# Patient Record
Sex: Male | Born: 1965 | Race: White | Hispanic: No | Marital: Married | Smoking: Never smoker
Health system: Southern US, Community
[De-identification: ages and names within clinical notes are randomized; demographics above are authoritative.]

## PROBLEM LIST (undated history)

## (undated) DIAGNOSIS — I499 Cardiac arrhythmia, unspecified: Secondary | ICD-10-CM

## (undated) DIAGNOSIS — G44309 Post-traumatic headache, unspecified, not intractable: Secondary | ICD-10-CM

## (undated) DIAGNOSIS — I10 Essential (primary) hypertension: Secondary | ICD-10-CM

## (undated) DIAGNOSIS — J45909 Unspecified asthma, uncomplicated: Secondary | ICD-10-CM

## (undated) DIAGNOSIS — S0990XS Unspecified injury of head, sequela: Secondary | ICD-10-CM

## (undated) DIAGNOSIS — Z9109 Other allergy status, other than to drugs and biological substances: Secondary | ICD-10-CM

## (undated) HISTORY — DX: Other allergy status, other than to drugs and biological substances: Z91.09

## (undated) HISTORY — DX: Unspecified asthma, uncomplicated: J45.909

## (undated) HISTORY — DX: Post-traumatic headache, unspecified, not intractable: G44.309

## (undated) HISTORY — DX: Unspecified injury of head, sequela: S09.90XS

## (undated) HISTORY — DX: Essential (primary) hypertension: I10

## (undated) HISTORY — DX: Cardiac arrhythmia, unspecified: I49.9

---

## 2001-10-26 HISTORY — PX: WRIST ARTHROSCOPY: SHX838

## 2006-02-16 ENCOUNTER — Ambulatory Visit: Payer: Self-pay | Admitting: Specialist

## 2008-10-18 LAB — PULMONARY FUNCTION TEST

## 2008-12-27 LAB — PULMONARY FUNCTION TEST

## 2009-07-18 LAB — PULMONARY FUNCTION TEST

## 2009-07-24 LAB — PULMONARY FUNCTION TEST

## 2009-10-24 LAB — PULMONARY FUNCTION TEST

## 2014-01-26 ENCOUNTER — Ambulatory Visit: Payer: Self-pay | Admitting: Allergy

## 2014-03-05 ENCOUNTER — Ambulatory Visit (INDEPENDENT_AMBULATORY_CARE_PROVIDER_SITE_OTHER): Payer: BC Managed Care – PPO | Admitting: Pulmonary Disease

## 2014-03-05 ENCOUNTER — Encounter (INDEPENDENT_AMBULATORY_CARE_PROVIDER_SITE_OTHER): Payer: Self-pay

## 2014-03-05 ENCOUNTER — Encounter: Payer: Self-pay | Admitting: Pulmonary Disease

## 2014-03-05 VITALS — BP 122/70 | HR 89 | Ht 67.0 in | Wt 281.0 lb

## 2014-03-05 DIAGNOSIS — J45909 Unspecified asthma, uncomplicated: Secondary | ICD-10-CM | POA: Insufficient documentation

## 2014-03-05 DIAGNOSIS — R05 Cough: Secondary | ICD-10-CM

## 2014-03-05 DIAGNOSIS — R059 Cough, unspecified: Secondary | ICD-10-CM

## 2014-03-05 DIAGNOSIS — R058 Other specified cough: Secondary | ICD-10-CM | POA: Insufficient documentation

## 2014-03-05 DIAGNOSIS — K219 Gastro-esophageal reflux disease without esophagitis: Secondary | ICD-10-CM | POA: Insufficient documentation

## 2014-03-05 DIAGNOSIS — J309 Allergic rhinitis, unspecified: Secondary | ICD-10-CM

## 2014-03-05 NOTE — Assessment & Plan Note (Signed)
Given the prior chest tightness, wheezing and shortness of breath with a long history of allergies I think that asthma is very likely.  However, I would like to see some PFTs to assess the severity.  Plan: -obtain full PFT -let me know if he has recurrence of chest tightness, wheezing, or  Dyspnea; if he has recurrence of these would start QVar bid first

## 2014-03-05 NOTE — Assessment & Plan Note (Signed)
If symptoms return, would discuss next best step with Dr. Jenne CampusMcQueen For now, continue meds as written

## 2014-03-05 NOTE — Patient Instructions (Signed)
[facility] Keep taking your medicines as prescribed Let us know if you develop shortness of breath, wheezing or chest tightness  Follow the GERD diet  We will see you back in 3 months or sooner if needed

## 2014-03-05 NOTE — Assessment & Plan Note (Signed)
I explained to him today that I think that this is due to allergic rhinitis, GERD, and possibly asthma.  It seems like he has really improved since stopping the Singulair, Flonase and Symbicort, giving credence to the thought that this is mostly due to acid reflux.    Plan: -for now, continue GERD lifestyle modification as per Dr. Jenne CampusMcQueen -if cough returns with post nasal drip, then would resume saline rinses first and an antihistamine

## 2014-03-05 NOTE — Progress Notes (Signed)
Subjective:    Patient ID: Ryan Clements, male    DOB: 01/17/1966, 48 y.o.   MRN: [id number]  HPI  This is a 48 year old male who comes to our clinic today to establish care for cough, sinus disease, reflux, and asthma. He says that he had a normal childhood without respiratory illnesses but he did have some degree of sinus symptoms. However in 2009 he was diagnosed by his primary care physician is having cough. Asthma. York SpanielSaid that this is made worse by drinking cold beverages. He said typically he would get a cough, upper respiratory viral infection which would last for quite a long time. He would get over the initial illness but he would still have a persistent dry cough for weeks if not months on end.  He eventually had allergy testing by the [place] [facility] clinic here in town and was found to have allergies to cockroaches, and various pollens. He was started on immunotherapy. He said that this made it to her he would "I get sick as much" but he would still have cough when he did get sick. Typically, when he would get sick he was treated with antibiotics, prednisone, and albuterol. Despite this he might initially get better but the cough would persist.  So after putting up with ongoing symptoms for many years he switched to the [facility] and saw Dr. Jenne CampusMcQueen.  There he had a CT scan of his sinuses. His immunotherapy was started at Dr. Mikey BussingMcQueen's office as well. Dr. Jenne CampusMcQueen had him stop Symbicort, Singulair, and a nasal steroid as they did not seem to be helping. This is about 6 weeks ago. Since then he has been dieting and exercising on a regular basis. He has lost 15 pounds. He says his cough is improved dramatically.  When he describes the cough he says that typically would be dry cough that would last all day. It was typically worse with talking particularly on the phone. It is sometimes associated with chest tightness and shortness of breath with wheezing. However, he notes that  his wife would notice the shortness of breath more often than he would.  Past Medical History  Diagnosis Date  . Hypertension   . Irregular heart rhythm   . Asthma   . Headaches due to old head injury   . Environmental allergies      Family History  Problem Relation Age of Onset  . Allergies Mother   . Asthma Mother   . Heart disease Maternal Grandfather   . Heart disease Paternal Uncle   . Heart disease Maternal Uncle   . Heart disease Maternal Aunt   . Cancer Maternal Aunt     brain  . Cancer Maternal Grandmother     lung     History   Social History  . Marital Status: Married    Spouse Name: N/A    Number of Children: N/A  . Years of Education: N/A   Occupational History  . Not on file.   Social History Main Topics  . Smoking status: Never Smoker   . Smokeless tobacco: Never Used  . Alcohol Use: Yes     Comment: occasional wine or beer  . Drug Use: No  . Sexual Activity: Not on file   Other Topics Concern  . Not on file   Social History Narrative  . No narrative on file     Allergies  Allergen Reactions  . Codeine   . Erythromycin  No outpatient prescriptions prior to visit.   No facility-administered medications prior to visit.      Review of Systems  Constitutional: Negative for fever and unexpected weight change.  HENT: Positive for congestion. Negative for dental problem, ear pain, nosebleeds, postnasal drip, rhinorrhea, sinus pressure, sneezing, sore throat and trouble swallowing.   Eyes: Negative for redness and itching.  Respiratory: Positive for cough, chest tightness and shortness of breath. Negative for wheezing.   Cardiovascular: Negative for palpitations and leg swelling.  Gastrointestinal: Negative for nausea and vomiting.  Genitourinary: Negative for dysuria.  Musculoskeletal: Negative for joint swelling.  Skin: Negative for rash.  Neurological: Positive for headaches.  Hematological: Does not bruise/bleed easily.   Psychiatric/Behavioral: Negative for dysphoric mood. The patient is not nervous/anxious.        Objective:   Physical Exam Filed Vitals:   03/05/14 0923  BP: 122/70  Pulse: 89  Height: 5\' 7"  (1.702 m)  Weight: 281 lb (127.461 kg)  SpO2: 97%   Gen: well appearing, no acute distress HEENT: NCAT, PERRL, EOMi, OP clear, neck supple without masses PULM: CTA B CV: RRR, no mgr, no JVD AB: BS+, soft, nontender, no hsm Ext: warm, no edema, no clubbing, no cyanosis Derm: no rash or skin breakdown Neuro: A&Ox4, CN II-XII intact, strength 5/5 in all 4 extremities       Assessment & Plan:   Cough I explained to him today that I think that this is due to allergic rhinitis, GERD, and possibly asthma.  It seems like he has really improved since stopping the Singulair, Flonase and Symbicort, giving credence to the thought that this is mostly due to acid reflux.    Plan: -for now, continue GERD lifestyle modification as per Dr. Jenne CampusMcQueen -if cough returns with post nasal drip, then would resume saline rinses first and an antihistamine  Allergic rhinitis If symptoms return, would discuss next best step with Dr. Jenne CampusMcQueen For now, continue meds as written  Asthma, chronic Given the prior chest tightness, wheezing and shortness of breath with a long history of allergies I think that asthma is very likely.  However, I would like to see some PFTs to assess the severity.  Plan: -obtain full PFT -let me know if he has recurrence of chest tightness, wheezing, or  Dyspnea; if he has recurrence of these would start QVar 80mcg bid first   Updated Medication List Outpatient Encounter Prescriptions as of 03/05/2014  Medication Sig  . cetirizine (ZYRTEC) 10 MG tablet Take 10 mg by mouth daily. Only taking 2X/week  . EPINEPHrine (EPIPEN) 0.3 mg/0.3 mL IJ SOAJ injection Inject 0.3 mg into the muscle as needed.  Marland Kitchen. omeprazole (PRILOSEC) 40 MG capsule Take 40 mg by mouth daily.  . [DISCONTINUED]  albuterol (PROVENTIL HFA;VENTOLIN HFA) 108 (90 BASE) MCG/ACT inhaler Inhale 2 puffs into the lungs every 6 (six) hours as needed for wheezing or shortness of breath.  . [DISCONTINUED] budesonide-formoterol (SYMBICORT) 160-4.5 MCG/ACT inhaler Inhale 2 puffs into the lungs 2 (two) times daily.  . [DISCONTINUED] fluticasone (FLONASE) 50 MCG/ACT nasal spray Place 1 spray into both nostrils daily.  . [DISCONTINUED] montelukast (SINGULAIR) 10 MG tablet Take 10 mg by mouth at bedtime.  . [DISCONTINUED] predniSONE (STERAPRED UNI-PAK) 10 MG tablet Prednisone taper, take as directed.

## 2014-03-06 ENCOUNTER — Telehealth: Payer: Self-pay | Admitting: Pulmonary Disease

## 2014-03-06 NOTE — Telephone Encounter (Signed)
[facility]  Keep taking your medicines as prescribed  Let us know if you develop shortness of breath, wheezing or chest tightness  Follow the GERD diet  We will see you back in 3 months or sooner if needed --  PCC's did ya'll try calling pt to schedule PFT's? thanks

## 2014-03-06 NOTE — Telephone Encounter (Signed)
I spoke with pt informed him of PFT has been scheduled for [facility]. Ryan Clements.[name]

## 2014-03-13 ENCOUNTER — Ambulatory Visit: Payer: Self-pay | Admitting: Pulmonary Disease

## 2014-03-13 LAB — PULMONARY FUNCTION TEST

## 2014-03-15 ENCOUNTER — Encounter: Payer: Self-pay | Admitting: Pulmonary Disease

## 2014-03-16 ENCOUNTER — Telehealth: Payer: Self-pay

## 2014-03-16 ENCOUNTER — Ambulatory Visit: Payer: Self-pay | Admitting: Internal Medicine

## 2014-03-16 NOTE — Telephone Encounter (Signed)
Message copied by Velvet Bathe on Fri Mar 16, 2014  9:45 AM ------      Message from: Max Fickle B      Created: Thu Mar 15, 2014 10:47 PM       A,            Please let him know that this PFTs were completely normal            Thanks      B ------

## 2014-03-16 NOTE — Telephone Encounter (Signed)
Pt aware of results.  Nothing further needed.  

## 2014-03-20 ENCOUNTER — Encounter: Payer: Self-pay | Admitting: Pulmonary Disease

## 2014-04-25 ENCOUNTER — Encounter: Payer: Self-pay | Admitting: Pulmonary Disease

## 2014-06-18 ENCOUNTER — Telehealth: Payer: Self-pay | Admitting: Pulmonary Disease

## 2014-06-18 NOTE — Telephone Encounter (Addendum)
Spoke with the pt  He is c/o ongoing cough  Wants OV with BQ this wk  BQ did not have any openings until next month  OV with MW for tomorrow-[place] address given to pt  Nothing further needed

## 2014-06-19 ENCOUNTER — Ambulatory Visit (INDEPENDENT_AMBULATORY_CARE_PROVIDER_SITE_OTHER): Payer: BC Managed Care – PPO | Admitting: Internal Medicine

## 2014-06-19 ENCOUNTER — Encounter: Payer: Self-pay | Admitting: *Deleted

## 2014-06-19 ENCOUNTER — Encounter: Payer: Self-pay | Admitting: Internal Medicine

## 2014-06-19 VITALS — BP 106/60 | HR 77 | Temp 98.0°F | Ht 67.0 in | Wt 270.8 lb

## 2014-06-19 DIAGNOSIS — R059 Cough, unspecified: Secondary | ICD-10-CM

## 2014-06-19 DIAGNOSIS — R05 Cough: Secondary | ICD-10-CM

## 2014-06-19 DIAGNOSIS — R058 Other specified cough: Secondary | ICD-10-CM

## 2014-06-19 MED ORDER — TRAMADOL HCL 50 MG PO TABS: ORAL_TABLET | ORAL | Status: DC

## 2014-06-19 MED ORDER — FAMOTIDINE 20 MG PO TABS
ORAL_TABLET | ORAL | Status: DC
Start: 2014-06-19 — End: 2014-12-31

## 2014-06-19 MED ORDER — PREDNISONE 10 MG PO TABS: ORAL_TABLET | ORAL | Status: DC

## 2014-06-19 NOTE — Patient Instructions (Addendum)
Omeprazole 40 mg Take 30-60 min before first meal of the day and pepcid 20 mg at bedtime and chlortrimeton 4 mg x 2 at bedtime  Instead of zyrtec >> For drainage take chlortrimeton (chlorpheniramine) 4 mg every 4 hours available over the counter (may cause drowsiness)   Prednisone 10 mg take  4 each am x 2 days,   2 each am x 2 days,  1 each am x 2 days and stop   Take delsym two tsp every 12 hours and supplement if needed with  tramadol 50 mg up to 2 every 4 hours to suppress the urge to cough. Swallowing water or using ice chips/non mint and menthol containing candies (such as lifesavers or sugarless jolly ranchers) are also effective.  You should rest your voice and avoid activities that you know make you cough.  Once you have eliminated the cough for 3 straight days try reducing the tramadol first,  then the delsym as tolerated.   GERD (REFLUX)  is an extremely common cause of respiratory symptoms, many times with no significant heartburn at all.    It can be treated with medication, but also with lifestyle changes including avoidance of late meals, excessive alcohol, smoking cessation, and avoid fatty foods, chocolate, peppermint, colas, red wine, and acidic juices such as orange juice.  NO MINT OR MENTHOL PRODUCTS SO NO COUGH DROPS  USE SUGARLESS CANDY INSTEAD (jolley ranchers or Stover's)  NO OIL BASED VITAMINS - use powdered substitutes.    See Dr Kendrick Fries in 2 weeks to regroup      Reflux v irritable  Cough Differentiator  Reflux  Comments   Do you awaken from a sound sleep coughing violently?  With trouble breathing?  No, after wakening    Do you have choking episodes when you cannot Get enough air, gasping for air ?  yes    Do you usually cough when you lie down into The bed, or when you just lie down to rest ?  no    Do you usually cough after meals or eating?  No - maybe cold drinks    Do you cough when (or after) you bend over?  no    GERD SCORE       Reflux virritable  larynx Differentiator  Neurogenic    Do you more-or-less cough all day long?  yes    Does change of temperature make you cough?  yes    Does laughing or chuckling cause you to cough?  no    Do fumes (perfume, automobile fumes, burned Toast, etc.,) cause you to cough ?  no    Does speaking, singing, or talking on the phone cause you to cough ?  Yes    Neurogenic/Airway score

## 2014-06-19 NOTE — Progress Notes (Signed)
Subjective:   Patient ID: Ryan Clements, male    DOB: 1966/09/16   MRN: [id number]   Brief patient profile:   This is a 67 yowm never smoker intermittent cough x since at least around 2005 persistent daily  since around 2009  Ryan Clements ov 03/05/14   has chronic cough, sinus disease, reflux, and asthma. He says that he had a normal childhood without respiratory illnesses but he did have some degree of sinus symptoms. However in 2009 he was diagnosed by his primary care physician is having cough. Asthma. Ryan Clements that this is made worse by drinking cold beverages. He said typically he would get a cough, upper respiratory viral infection which would last for quite a long time. He would get over the initial illness but he would still have a persistent dry cough   - daily cough x 2009  - McQueen neg sinus w/u   He eventually had allergy testing by the [facility] allergy clinic here in town and was found to have allergies to cockroaches, and various pollens. He was started on immunotherapy. He said that this helped where he would "I get sick as much" but he would still have cough when he did get sick. Typically, when he would get sick he was treated with antibiotics, prednisone, and albuterol. Despite this he might initially get better but the cough would persist.  - allergy shots were started 2009 and ongoing   So after putting up with ongoing symptoms for many years he switched to the [facility] and saw Dr. Jenne Clements.  There he had a CT scan of his sinuses. His immunotherapy was started at Dr. Mikey Clements office as well. Dr. Jenne Clements had him stop Symbicort, Singulair, and a nasal steroid as they did not seem to be helping.  rec We will pulmonary function test at [facility] Keep taking your medicines as prescribed Let us know if you develop shortness of breath, wheezing or chest tightness Follow the GERD diet    06/19/2014 f/u ov/Ryan Clements re: Chief Complaint  Patient presents with  . Acute Visit   Pt states that he devoloped bronchitis approx 3 wks ago and cough still has not resolved.  Cough is mainly non prod and trigerred by cold water.     last" better" while off everything but reflux meds/ allergy shorts/ zyrtec/ but still dry cough  Then acutely worse with "head cold again" 3 week prior to OV    with fever/ nasty mucus rx Ryan Clements  augmentin/ prednisone/ no neb  On omeprazole 40 mg Take 30-60 min before first meal of the day as maint, now over the acute "head cold" symptoms but back to baseline cough:  [name] Reflux Comments  Do you awaken from a sound sleep coughing violently?                            With trouble breathing? No, after wakening    Do you have choking episodes when you cannot  Get enough air, gasping for air ?              yes   Do you usually cough when you lie down into  The bed, or when you just lie down to rest ?                          no   Do you usually cough  after meals or eating?         No - maybe cold drinks   Do you cough when (or after) you bend over?    no   GERD SCORE     [name]   Do you more-or-less cough all day long? yes   Does change of temperature make you cough? yes   Does laughing or chuckling cause you to cough? no   Do fumes (perfume, automobile fumes, burned  Toast, etc.,) cause you to cough ?      no   Does speaking, singing, or talking on the phone cause you to cough   ?               Yes    Neurogenic/Airway score      No obvious day to day or daytime variabilty or assoc  cp or chest tightness, subjective wheeze overt active  sinus or hb symptoms. No unusual exp hx or h/o childhood pna/ asthma or knowledge of premature birth.   Also denies any obvious fluctuation of symptoms with weather or environmental changes or other aggravating or alleviating factors except as outlined above   Current Medications, Allergies, Complete Past Medical  History, Past Surgical History, Family History, and Social History were reviewed in [employer] record.  ROS  The following are not active complaints unless bolded sore throat, dysphagia, dental problems, itching, sneezing,  nasal congestion or excess/ purulent secretions, ear ache,   fever, chills, sweats, unintended wt loss, pleuritic or exertional cp, hemoptysis,  orthopnea pnd or leg swelling, presyncope, palpitations, heartburn, abdominal pain, anorexia, nausea, vomiting, diarrhea  or change in bowel or urinary habits, change in stools or urine, dysuria,hematuria,  rash, arthralgias, visual complaints, headache, numbness weakness or ataxia or problems with walking or coordination,  change in mood/affect or memory.      Past Medical History  Diagnosis Date  . Hypertension   . Irregular heart rhythm   . Asthma   . Headaches due to old head injury   . Environmental allergies      Family History  Problem Relation Age of Onset  . Allergies Mother   . Asthma Mother   . Heart disease Maternal Grandfather   . Heart disease Paternal Uncle   . Heart disease Maternal Uncle   . Heart disease Maternal Aunt   . Cancer Maternal Aunt     brain  . Cancer Maternal Grandmother     lung     History   Social History  . Marital Status: Married    Spouse Name: N/A    Number of Children: N/A  . Years of Education: N/A   Occupational History  . Not on file.   Social History Main Topics  . Smoking status: Never Smoker   . Smokeless tobacco: Never Used  . Alcohol Use: Yes     Comment: occasional wine or beer  . Drug Use: No  . Sexual Activity: Not on file   Other Topics Concern  . Not on file   Social History Narrative  . No narrative on file     Allergies  Allergen Reactions  . Codeine   . Erythromycin               Objective:   Physical Exam  Wt Readings from Last 3 Encounters:  06/19/14 270 lb 12.8 oz (122.834 kg)  03/05/14 281 lb (127.461  kg)     Gen: well  appearing, no acute distress/ extremely harsh barking quality cough    HEENT: nl dentition, turbinates, and orophanx. Nl external ear canals without cough reflex   NECK :  without JVD/Nodes/TM/ nl carotid upstrokes bilaterally   LUNGS: no acc muscle use, clear to A and P bilaterally without cough on insp or exp maneuvers   CV:  RRR  no s3 or murmur or increase in P2, no edema   ABD:  soft and nontender with nl excursion in the supine position. No bruits or organomegaly, bowel sounds nl  MS:  warm without deformities, calf tenderness, cyanosis or clubbing  SKIN: warm and dry without lesions    NEURO:  alert, approp, no deficits          Assessment & Plan:

## 2014-06-19 NOTE — Assessment & Plan Note (Signed)
The most common causes of chronic cough in immunocompetent adults include the following: upper airway cough syndrome (UACS), previously referred to as postnasal drip syndrome (PNDS), which is caused by variety of rhinosinus conditions; (2) asthma; (3) GERD; (4) chronic bronchitis from cigarette smoking or other inhaled environmental irritants; (5) nonasthmatic eosinophilic bronchitis; and (6) bronchiectasis.   These conditions, singly or in combination, have accounted for up to 94% of the causes of chronic cough in prospective studies.   Other conditions have constituted no >6% of the causes in prospective studies These have included bronchogenic carcinoma, chronic interstitial pneumonia, sarcoidosis, left ventricular failure, ACEI-induced cough, and aspiration from a condition associated with pharyngeal dysfunction.    Chronic cough is often simultaneously caused by more than one condition. A single cause has been found from 38 to 82% of the time, multiple causes from 18 to 62%. Multiply caused cough has been the result of three diseases up to 42% of the time.       Based on hx and exam, this is most likely:  Classic Upper airway cough syndrome, so named because it's frequently impossible to sort out how much is  CR/sinusitis with freq throat clearing (which can be related to primary GERD)   vs  causing  secondary (" extra esophageal")  GERD from wide swings in gastric pressure that occur with throat clearing, often  promoting self use of mint and menthol lozenges that reduce the lower esophageal sphincter tone and exacerbate the problem further in a cyclical fashion.   These are the same pts (now being labeled as having "irritable larynx syndrome" by some cough centers) who not infrequently have a history of having failed to tolerate ace inhibitors,  dry powder inhalers or biphosphonates or report having atypical reflux symptoms that don't respond to standard doses of PPI , and are easily confused as  having aecopd or asthma flares by even experienced allergists/ pulmonologists.   The first step is to maximize acid suppression and eliminate cyclical coughing then regroup if the cough persists with Dr Kendrick Fries  Discussed with pt The standardized cough guidelines published in Chest by [place] in 2006 are still the best available and consist of a multiple step process (up to 12!) , not a single office visit,  and are intended  to address this problem logically,  with an alogrithm dependent on response to empiric treatment at  each progressive step  to determine a specific diagnosis with  minimal addtional testing needed. Therefore if adherence is an issue or can't be accurately verified,  it's very unlikely the standard evaluation and treatment will be successful here.    Furthermore, response to therapy (other than acute cough suppression, which should only be used short term with avoidance of narcotic containing cough syrups if possible), can be a gradual process for which the patient may perceive immediate benefit.  Unlike going to an eye doctor where the best perscription is almost always the first one and is immediately effective, this is almost never the case in the management of chronic cough syndromes. Therefore the patient needs to commit up front to consistently adhere to recommendations  for up to 6 weeks of therapy directed at the likely underlying problem(s) before the response can be reasonably evaluated.   See instructions for specific recommendations which were reviewed directly with the patient who was given a copy with highlighter outlining the key components.

## 2014-06-25 ENCOUNTER — Ambulatory Visit (INDEPENDENT_AMBULATORY_CARE_PROVIDER_SITE_OTHER): Payer: BC Managed Care – PPO | Admitting: Internal Medicine

## 2014-06-25 ENCOUNTER — Encounter: Payer: Self-pay | Admitting: Internal Medicine

## 2014-06-25 ENCOUNTER — Telehealth: Payer: Self-pay | Admitting: Internal Medicine

## 2014-06-25 VITALS — BP 110/70 | HR 70 | Temp 98.0°F | Ht 67.0 in | Wt 269.0 lb

## 2014-06-25 DIAGNOSIS — R058 Other specified cough: Secondary | ICD-10-CM

## 2014-06-25 DIAGNOSIS — R059 Cough, unspecified: Secondary | ICD-10-CM

## 2014-06-25 DIAGNOSIS — R05 Cough: Secondary | ICD-10-CM

## 2014-06-25 DIAGNOSIS — J309 Allergic rhinitis, unspecified: Secondary | ICD-10-CM

## 2014-06-25 MED ORDER — FLUTTER DEVI
Status: DC
Start: 2014-06-25 — End: 2017-01-20

## 2014-06-25 MED ORDER — GABAPENTIN 100 MG PO CAPS: 100.0000 mg | ORAL_CAPSULE | Freq: Three times a day (TID) | ORAL | Status: DC

## 2014-06-25 MED ORDER — MEPERIDINE HCL 50 MG PO TABS: 50.0000 mg | ORAL_TABLET | ORAL | Status: DC | PRN

## 2014-06-25 NOTE — Telephone Encounter (Signed)
Discussed by phone Demerol dose should start at 1 every 4 and up to 2 every 4 if still coughing.  The cpap use is fine, it will not interfere.  The point is that the harsh cough is slamming his airway together and must be stopped to fix this problem and the only time I want him to use the flutter is to have if still  breaks through the demerol (it is sometimes used to promote mucus clearance but in his case he has no excess mucus so I want him to use it differently)

## 2014-06-25 NOTE — Progress Notes (Signed)
Subjective:   Patient ID: Ryan Clements, male    DOB: April 09, 1966   MRN: [id number]   Brief patient profile:   This is a 24 yowm never smoker intermittent cough x since at least around 2005 persistent daily  since around 2009  McQuaid ov 03/05/14   has chronic cough, sinus disease, reflux, and asthma. He says that he had a normal childhood without respiratory illnesses but he did have some degree of sinus symptoms. However in 2009 he was diagnosed by his primary care physician is having cough. Asthma. [place] Spaniel that this is made worse by drinking cold beverages. He said typically he would get a cough, upper respiratory viral infection which would last for quite a long time. He would get over the initial illness but he would still have a persistent dry cough   - daily cough x 2009  - McQueen neg sinus w/u   He eventually had allergy testing by the [facility] here in town and was found to have allergies to cockroaches, and various pollens. He was started on immunotherapy. He said that this helped where he would "I get sick as much" but he would still have cough when he did get sick. Typically, when he would get sick he was treated with antibiotics, prednisone, and albuterol. Despite this he might initially get better but the cough would persist.  - allergy shots were started 2009 and ongoing   So after putting up with ongoing symptoms for many years he switched to the [facility] and saw Dr. Jenne Campus.  There he had a CT scan of his sinuses. His immunotherapy was started at Dr. Mikey Bussing office as well. Dr. Jenne Campus had him stop Symbicort, Singulair, and a nasal steroid as they did not seem to be helping.  rec We will pulmonary function test at [facility] Keep taking your medicines as prescribed Let us know if you develop shortness of breath, wheezing or chest tightness Follow the GERD diet    06/19/2014 f/u ov/Ryan Clements re:  Refractory chronic cough x 6 y Chief Complaint  Patient  presents with  . Acute Visit    Pt states that he devoloped bronchitis approx 3 wks ago and cough still has not resolved.  Cough is mainly non prod and trigerred by cold water.     last" better" while off everything but reflux meds/ allergy shorts/ zyrtec/ but still dry cough  Then acutely worse with "head cold again" 3 week prior to OV    with fever/ nasty mucus rx Jenne Campus  augmentin/ prednisone/ no neb  On omeprazole 40 mg Take 30-60 min before first meal of the day as maint, now over the acute "head cold" symptoms but back to baseline cough:  [name] Reflux Comments  Do you awaken from a sound sleep coughing violently?                            With trouble breathing? No, after wakening    Do you have choking episodes when you cannot  Get enough air, gasping for air ?              yes   Do you usually cough when you lie down into  The bed, or when you just lie down to rest ?  no   Do you usually cough after meals or eating?         No - maybe cold drinks   Do you cough when (or after) you bend over?    no   GERD SCORE     [name]   Do you more-or-less cough all day long? yes   Does change of temperature make you cough? yes   Does laughing or chuckling cause you to cough? no   Do fumes (perfume, automobile fumes, burned  Toast, etc.,) cause you to cough ?      no   Does speaking, singing, or talking on the phone cause you to cough   ?               Yes    Neurogenic/Airway score        06/25/2014 f/u ov/Ryan Clements re: refractory daily cough x 6y Chief Complaint  Patient presents with  . Acute Visit    Pt states that his cough is only better when he takes the medications prescribed at the last visit. He has not went a day without any cough. He states that when meds start to wear off his cough is worse.    even on tramadol 50 2 every 4 hours still harsh barking cough or  arrival. Made worse by voice use. Did not have wife review the cough questionaire as requested. "nothing's changed" - it's accurate (while at last ov was constantly texting wife to confirm the hx was accurate    No obvious day to day or daytime variabilty or assoc sob  cp or chest tightness, subjective wheeze overt active  sinus or hb symptoms. No unusual exp hx or h/o childhood pna/ asthma or knowledge of premature birth.   Also denies any obvious fluctuation of symptoms with weather or environmental changes or other aggravating or alleviating factors except as outlined above   Current Medications, Allergies, Complete Past Medical History, Past Surgical History, Family History, and Social History were reviewed in [employer] record.  ROS  The following are not active complaints unless bolded sore throat, dysphagia, dental problems, itching, sneezing,  nasal congestion or excess/ purulent secretions, ear ache,   fever, chills, sweats, unintended wt loss, pleuritic or exertional cp, hemoptysis,  orthopnea pnd or leg swelling, presyncope, palpitations, heartburn, abdominal pain, anorexia, nausea, vomiting, diarrhea  or change in bowel or urinary habits, change in stools or urine, dysuria,hematuria,  rash, arthralgias, visual complaints, headache, numbness weakness or ataxia or problems with walking or coordination,  change in mood/affect or memory.      Past Medical History  Diagnosis Date  . Hypertension   . Irregular heart rhythm   . Asthma   . Headaches due to old head injury   . Environmental allergies      Family History  Problem Relation Age of Onset  . Allergies Mother   . Asthma Mother   . Heart disease Maternal Grandfather   . Heart disease Paternal Uncle   . Heart disease Maternal Uncle   . Heart disease Maternal Aunt   . Cancer Maternal Aunt     brain  . Cancer Maternal Grandmother     lung     History   Social History  . Marital Status: Married     Spouse Name: N/A    Number of Children: N/A  . Years of Education: N/A   Occupational History  . Not on file.  Social History Main Topics  . Smoking status: Never Smoker   . Smokeless tobacco: Never Used  . Alcohol Use: Yes     Comment: occasional wine or beer  . Drug Use: No  . Sexual Activity: Not on file   Other Topics Concern  . Not on file   Social History Narrative  . No narrative on file     Allergies  Allergen Reactions  . Codeine   . Erythromycin               Objective:   Physical Exam  06/25/2014        269  Wt Readings from Last 3 Encounters:  06/19/14 270 lb 12.8 oz (122.834 kg)  03/05/14 281 lb (127.461 kg)     Gen: well appearing, no acute distress/ extremely harsh barking quality cough    HEENT: nl dentition, turbinates, and orophanx. Nl external ear canals without cough reflex   NECK :  without JVD/Nodes/TM/ nl carotid upstrokes bilaterally   LUNGS: no acc muscle use, clear to A and P bilaterally without cough on insp or exp maneuvers   CV:  RRR  no s3 or murmur or increase in P2, no edema   ABD:  soft and nontender with nl excursion in the supine position. No bruits or organomegaly, bowel sounds nl  MS:  warm without deformities, calf tenderness, cyanosis or clubbing  SKIN: warm and dry without lesions    NEURO:  alert, approp, no deficits     No cxr on file      Assessment & Plan:

## 2014-06-25 NOTE — Telephone Encounter (Signed)
Pt returning call.Stanley A Dalton ° °

## 2014-06-25 NOTE — Telephone Encounter (Signed)
Spoke with the pt  He states that he was reading about demerol and the fine print says to let your doctor know if you have sleep apnea  He is concerned b/c he has OSA  Wants to make sure MW is aware of this  He also c/o the flutter device making him cough more  Please advise thanks

## 2014-06-25 NOTE — Patient Instructions (Addendum)
Take delsym two tsp every 12 hours and supplement if needed with demerol  50 mg up to 2 every 4 hours to suppress the urge to cough. Swallowing water or using ice chips/non mint and menthol containing candies (such as lifesavers or sugarless jolly ranchers) are also effective.  You should rest your voice and avoid activities that you know make you cough.  Once you have eliminated the cough for 3 straight days try reducing the demerol  first,  then the delsym as tolerated.    When you cough, always cough into the flutter valve to prevent you from traumatizing your upper airway   neurontin 100 mg three times a day until you see Dr Marinell Blight

## 2014-06-25 NOTE — Telephone Encounter (Signed)
lmomtcb x1 

## 2014-06-26 NOTE — Assessment & Plan Note (Signed)
2009 Allergy testing> 4+ feathers, house duse, dust mite, american cockroach  rec continue allergy shots and use 1st gen H1 as per guidelines to eliminate pnds from contributing to cough

## 2014-06-26 NOTE — Assessment & Plan Note (Addendum)
Cyclical cough regimen 06/19/2014 > not effective  I had an extended discussion with the patient today lasting 15 to 20 minutes of a 25 minute visit on the following issues:  He will continue to cough as long as he continues to cough this violently regardless of any factor that initiates the cough in the first place.  rec Demerol acutely to control the cough and flutter valve when starts to cough to prevent airway trauma neurontin 100 tid new maint rx to increase to max of 300 tid  Consideration for referral to [facility] voice center if not able to control at this level  See instructions for specific recommendations which were reviewed directly with the patient who was given a copy with highlighter outlining the key components.

## 2014-06-29 ENCOUNTER — Telehealth: Payer: Self-pay | Admitting: Internal Medicine

## 2014-06-29 ENCOUNTER — Encounter: Payer: Self-pay | Admitting: Internal Medicine

## 2014-06-29 NOTE — Telephone Encounter (Signed)
Per 06/25/14:" Patient Instructions      Take delsym two tsp every 12 hours and supplement if needed with demerol  50 mg up to 2 every 4 hours to suppress the urge to cough. Swallowing water or using ice chips/non mint and menthol containing candies (such as lifesavers or sugarless jolly ranchers) are also effective.  You should rest your voice and avoid activities that you know make you cough. Once you have eliminated the cough for 3 straight days try reducing the demerol  first,  then the delsym as tolerated.   When you cough, always cough into the flutter valve to prevent you from traumatizing your upper airway  neurontin 100 mg three times a day until you see Dr Bethanie Dicker spoke with pt. He reports he has followed step by step of what MW recommended. Pt reports during this time his cough subsided 95%. Pt stopped the demerol yesterday afternoon. Since today he has restarted the cough like he did in the beginning. Please advise MW thanks  Allergies  Allergen Reactions  . Codeine   . Erythromycin

## 2014-06-29 NOTE — Telephone Encounter (Signed)
Goal was 100% - next step is to return to see mcQuaid or Tammy NP with all meds in hand to regroup  In meantime ok to push the neurontin to 300 tid   Can't do more demerol RX without ov but can take what he has left - nothing else to offer unless he wants to get worked in this pm as I am off

## 2014-06-29 NOTE — Telephone Encounter (Signed)
Called pt, did get 95% relief but not taking neurontin as directed   rec titrate up neurontin to 300 tid and if tolerates this dose needs a new rx called in next week. If doesn't tolerate this dose needs early appt with Tammy or McQuaid as no more narcotics should be used here

## 2014-06-29 NOTE — Telephone Encounter (Signed)
Called and spoke with pt and he stated that he is not getting any better and does not feel that he can continue to come in and pay the 45$ copay and then pay for other medications that he may not be able to use. The pt stated that he is not feeling any differently than he was when MW seen him on Monday.  He stated that he would just like to talk to TP on the phone .  Spoke with TP and will forward this message back to MW.  MW could you call and speak with the pt?  thanks

## 2014-07-05 ENCOUNTER — Telehealth: Payer: Self-pay | Admitting: Pulmonary Disease

## 2014-07-05 NOTE — Telephone Encounter (Signed)
Per 06/25/14 OV: neurontin 100 mg three times a day until you see Dr Marinell Blight    Please advise MW if okay to send in RX? thanks

## 2014-07-05 NOTE — Telephone Encounter (Signed)
OK by me but I think this message was meant for Dr. Sherene Sires

## 2014-07-06 MED ORDER — GABAPENTIN 100 MG PO CAPS: 100.0000 mg | ORAL_CAPSULE | Freq: Three times a day (TID) | ORAL | Status: DC

## 2014-07-06 NOTE — Telephone Encounter (Signed)
Dr. Sherene Sires, are you ok with rx as well?

## 2014-07-06 NOTE — Telephone Encounter (Signed)
Spoke with pt and clarified rx directions per MW.  Pt verbalized understanding.  Rx sent.

## 2014-07-06 NOTE — Telephone Encounter (Signed)
The plan was for him to gradually increase his neurontin to goal of 300 tid using the 100's  So ok to rx with the 300 dose @ tid but make sure he does the math correctly on the conversion and only give 90 of the 300s with no refills

## 2014-07-10 ENCOUNTER — Ambulatory Visit (INDEPENDENT_AMBULATORY_CARE_PROVIDER_SITE_OTHER)
Admission: RE | Admit: 2014-07-10 | Discharge: 2014-07-10 | Disposition: A | Discharge status: Home / Self Care | Payer: BC Managed Care – PPO | Source: Ambulatory Visit | Attending: Pulmonary Disease | Admitting: Pulmonary Disease

## 2014-07-10 ENCOUNTER — Ambulatory Visit (INDEPENDENT_AMBULATORY_CARE_PROVIDER_SITE_OTHER): Payer: BC Managed Care – PPO | Admitting: Pulmonary Disease

## 2014-07-10 ENCOUNTER — Encounter: Payer: Self-pay | Admitting: Pulmonary Disease

## 2014-07-10 VITALS — BP 128/70 | HR 77 | Ht 67.0 in | Wt 273.0 lb

## 2014-07-10 DIAGNOSIS — R05 Cough: Secondary | ICD-10-CM

## 2014-07-10 DIAGNOSIS — R059 Cough, unspecified: Secondary | ICD-10-CM

## 2014-07-10 DIAGNOSIS — R058 Other specified cough: Secondary | ICD-10-CM

## 2014-07-10 MED ORDER — GABAPENTIN 300 MG PO CAPS: 300.0000 mg | ORAL_CAPSULE | Freq: Three times a day (TID) | ORAL | Status: DC

## 2014-07-10 MED ORDER — AMITRIPTYLINE HCL 25 MG PO TABS: 25.0000 mg | ORAL_TABLET | Freq: Every day | ORAL | Status: DC

## 2014-07-10 NOTE — Assessment & Plan Note (Addendum)
Ryan Clements does not have asthma as documented by my partner and as seen in completely normal lung function tests.  He does have ongoing laryngeal irritation perpetuated by a severe harsh cough which he refuses to suppress as well as likely some degree of acid reflux and postnasal drip. Is allergic rhinitis has been acting up lately and is contributing to this.  However, I think the most likely etiology of his cough at this point is cyclical cough. We spent a very long time in clinic today discussing the fact that when his larynx gets inflamed from ongoing coughing it will continue to perpetuate cough. It seems to me that he has made very little effort to actually suppress the cough. I spent a long time in clinic today explaining to him the cough is not a reflex and it is in fact behavioral. What we need to do is to try to soothe his throat is much as possible. Some people have used drugs like Neurontin as well as Elavil to treat the laryngeal irritation that comes from ongoing coughing.  Plan: - Obtain chest x-ray for completeness sake -Treat postnasal drip with chlorpheniramine, phenylephrine, and Nasacort -Explained at length how Nasacort works and that it needs regular use -Voice rest strongly encouraged -Hard candies, warm beverages encourage for voice rest -Continue Neurontin 300 mg 3 times a day -Add Elavil 25 mg each bedtime, risks and proper use of this medicine were discussed at length. -If no improvement in 4 weeks then we will refer him to the [facility] for behavioral therapy  Greater than 25 minutes were dedicated to answering all of his questions and in direct counseling.

## 2014-07-10 NOTE — Patient Instructions (Signed)
Take the chlorpheniramine for cough, but don't take it within four hours of taking the Elavil Take the neurontin three times per day Take the elavil  at night Use nasacort 2 sprays each nostril daily Take phenylephrine OTC tabs as needed when the nasal mucus is increased  I am going to order a chest x-ray to make sure that there is nothing else going on  Let us know if you are not better in 4 weeks and then we will refer you to the [facility] voice center  We will see you back in 6 weeks or sooner if needed

## 2014-07-10 NOTE — Progress Notes (Signed)
Subjective:    Patient ID: Ryan Clements, male    DOB: 1966/03/09, 48 y.o.   MRN: [id number]  Synopsis: 48 year old male with allergic rhinitis and upper airway cough syndrome. Has severe, persistent cough after episodes of bronchitis. May 2015 pulmonary function testing was completely normal  HPI  07/10/14 ROV > Davien had bronchitis about a month ago and saw Dr. Jenne Campus who prescribed prednisone and antibiotics.  Was also seen by ENT who performed allergy testing and is now back on allergy shots.  He says that his cough improved after I initially saw him back in May.  After the  Bronchitis a month ago he initially got better but the cough persisted.  He saw my partner Dr. Sherene Sires who prescribed narcotics and tramadol which helped some, but the cough recurred.  He was also started on neurontin and has titrated up to  tid.  He says that this doesn't affect his sensorium but he doesn't think that it is helping.   In the last week he continues to have a dry hacking cough which is worse with talking.  The more he drinks and eats cold things that makes him cough.   He thinks that he has some post nasal drip which has been there for a while.   He used a flutter valve when he had cough he thinks that   Past Medical History  Diagnosis Date  . Hypertension   . Irregular heart rhythm   . Asthma   . Headaches due to old head injury   . Environmental allergies      Review of Systems  Constitutional: Negative for fever, chills and fatigue.  HENT: Positive for postnasal drip and rhinorrhea. Negative for sinus pressure.   Respiratory: Positive for cough and shortness of breath. Negative for wheezing.   Cardiovascular: Negative for chest pain, palpitations and leg swelling.       Objective:   Physical Exam Filed Vitals:   07/10/14 1002  BP: 128/70  Pulse: 77  Height:  (1.702 m)  Weight: 273 lb (123.832 kg)  SpO2: 96%  RA  Gen: frequent harsh cough HEENT: NCAT,  EOMi, OP clear,    PULM: CTA B CV: RRR, no mgr, no JVD AB: BS+, soft, nontender Ext: warm, no edema, no clubbing, no cyanosis Derm: no rash or skin breakdown Neuro: A&Ox4, MAEW       Assessment & Plan:   Upper airway cough syndrome [name] does not have asthma as documented by my partner and as seen in completely normal lung function tests.  He does have ongoing laryngeal irritation perpetuated by a severe harsh cough which he refuses to suppress as well as likely some degree of acid reflux and postnasal drip. Is allergic rhinitis has been acting up lately and is contributing to this.  However, I think the most likely etiology of his cough at this point is cyclical cough. We spent a very long time in clinic today discussing the fact that when his larynx gets inflamed from ongoing coughing it will continue to perpetuate cough. It seems to me that he has made very little effort to actually suppress the cough. I spent a long time in clinic today explaining to him the cough is not a reflex and it is in fact behavioral. What we need to do is to try to soothe his throat is much as possible. Some people have used drugs like Neurontin as well as Elavil to treat the laryngeal irritation that comes from  ongoing coughing.  Plan: - Obtain chest x-ray for completeness sake -Treat postnasal drip with chlorpheniramine, phenylephrine, and Nasacort -Explained at length how Nasacort works and that it needs regular use -Voice rest strongly encouraged -Hard candies, warm beverages encourage for voice rest -Continue Neurontin 300 mg 3 times a day -Add Elavil 25 mg each bedtime, risks and proper use of this medicine were discussed at length. -If no improvement in 4 weeks then we will refer him to the [facility] for behavioral therapy  Greater than 25 minutes were dedicated to answering all of his questions and in direct counseling.    Updated Medication List Outpatient Encounter Prescriptions as of  07/10/2014  Medication Sig  . chlorpheniramine (CHLOR-TRIMETON) 4 MG tablet Take 8 mg by mouth at bedtime.  Marland Kitchen dextromethorphan (DELSYM) 30 MG/5ML liquid 2 tsp every 12 hours as needed  . EPINEPHrine (EPIPEN) 0.3 mg/0.3 mL IJ SOAJ injection Inject 0.3 mg into the muscle as needed.  . famotidine (PEPCID) 20 MG tablet One at bedtime  . gabapentin (NEURONTIN) 100 MG capsule Take 1 capsule (100 mg total) by mouth 3 (three) times daily.  Marland Kitchen omeprazole (PRILOSEC) 40 MG capsule Take 40 mg by mouth daily.  Marland Kitchen Respiratory Therapy Supplies (FLUTTER) DEVI Use as directed  . [DISCONTINUED] meperidine (DEMEROL) 50 MG tablet Take 1 tablet (50 mg total) by mouth every 4 (four) hours as needed for severe pain.

## 2014-07-11 NOTE — Progress Notes (Signed)
Quick Note:  lmtcb X1 to relay results. ______ 

## 2014-07-12 NOTE — Progress Notes (Signed)
Quick Note:  Called spoke with patient, advised of cxr results / recs as stated by BQ. Pt verbalized his understanding but does have an additional question: the Gabapentin given at the 9.15.15 has "significantly improved" pt's cough, reducing it by 80% but he feels excess fatigue during the day and finds it difficult to rouse in the mornings. Is this a typical side effect? ______

## 2014-07-17 NOTE — Progress Notes (Signed)
I think he must be talking about the elavil because I didn't change the gabapentin started by Dr. Sherene Sires.  If so, then it can make him sleepy. Try taking earlier in the evening. Also, make sure he is only taking one pill of the  dose of gabapentin tid.  I explained to him that this would be the case in his office visit.

## 2014-08-10 ENCOUNTER — Telehealth: Payer: Self-pay | Admitting: Pulmonary Disease

## 2014-08-10 DIAGNOSIS — R05 Cough: Secondary | ICD-10-CM

## 2014-08-10 DIAGNOSIS — R058 Other specified cough: Secondary | ICD-10-CM

## 2014-08-10 NOTE — Telephone Encounter (Signed)
Pt has called back. States if he is to be seen he will have to leave home in the next 30 minutes.

## 2014-08-10 NOTE — Telephone Encounter (Signed)
It sounds like he needs to be seen at the [facility] for cough as the side effects are outweighing the benefits.  Please let him know that if he can tolerate the side effects for a little while longer then he should continue taking the elavil and gabapentin until he sees them.  However if he cannot then he should just stop the medications at any point and follow up with them.

## 2014-08-10 NOTE — Telephone Encounter (Signed)
I spoke with the pt and notified of the below recs per Dr Kendrick FriesMcQuaid  He verbalized understanding and agrees to referral  He has refills on both of his meds and will continue until he is seen there  [facility]

## 2014-08-10 NOTE — Telephone Encounter (Signed)
(  Back History).  Pt was seen in [place] on 9/15 & told to f/u in 6 wks.  Pt was not notified to schedule an appt.  Pt states this is not the first time of not being notified of needing to schedule an appt.  Pt is concerned w/ this issue & asked me to let BQ know of the disconnect.  Pt will run out of meds today, so needs an asap resolution.  Thanks!  Antionette FairyHolly D Pryor

## 2014-08-10 NOTE — Telephone Encounter (Signed)
Called spoke with patient who reports that after beginning the Elavil at the 9.15.15 ov w/ BQ his cough immediately improved.  Reports the cough is nearly resolved while taking it, but as the medication begins to wear off he can feel the urge to cough return.  Overall he is approximately 95% improved with some residual cough and throat clearing.  He is concerned however about both the long-term effects of taking the Elavil and Gabapentin vs stopping these medications.  Pt reports "it's about all he can do to get out of bed in the morning" and is extremely fatigued throughout the day.  But he also does not want to stop therapy if this will cause his cough to return.  Pt stated he is eager and willing to try any recommendations.  Pt is out of his first refill on the Elavil, but has additional at the pharmacy.  He would like BQ's recommendations before refilling this.  Pt is also due for his 6 week follow up at the end of October/early November and will schedule this with patient when we call him back with BQ's recs.  Dr Kendrick FriesMcQuaid please advise, thank you.  Per the 9.15.15 ov: Patient Instructions      Take the chlorpheniramine for cough, but don't take it within four hours of taking the Elavil Take the neurontin three times per day Take the elavil 25mg  at night Use nasacort 2 sprays each nostril daily Take phenylephrine OTC tabs as needed when the nasal mucus is increased  I am going to order a chest x-ray to make sure that there is nothing else going on  Let us know if you are not better in 4 weeks and then we will refer you to the [facility]  We will see you back in 6 weeks or sooner if needed

## 2014-10-12 ENCOUNTER — Other Ambulatory Visit: Payer: Self-pay

## 2014-10-12 MED ORDER — GABAPENTIN 300 MG PO CAPS: 300.0000 mg | ORAL_CAPSULE | Freq: Three times a day (TID) | ORAL | Status: DC

## 2014-10-17 ENCOUNTER — Other Ambulatory Visit: Payer: Self-pay | Admitting: *Deleted

## 2014-10-17 MED ORDER — AMITRIPTYLINE HCL 25 MG PO TABS: 25.0000 mg | ORAL_TABLET | Freq: Every day | ORAL | Status: DC

## 2014-10-19 ENCOUNTER — Other Ambulatory Visit: Payer: Self-pay | Admitting: Internal Medicine

## 2014-12-31 ENCOUNTER — Encounter: Payer: Self-pay | Admitting: Pulmonary Disease

## 2014-12-31 ENCOUNTER — Ambulatory Visit (INDEPENDENT_AMBULATORY_CARE_PROVIDER_SITE_OTHER): Payer: BLUE CROSS/BLUE SHIELD | Admitting: Pulmonary Disease

## 2014-12-31 VITALS — BP 138/84 | HR 108 | Temp 97.7°F | Ht 67.0 in | Wt 300.8 lb

## 2014-12-31 DIAGNOSIS — R05 Cough: Secondary | ICD-10-CM

## 2014-12-31 DIAGNOSIS — R058 Other specified cough: Secondary | ICD-10-CM

## 2014-12-31 DIAGNOSIS — R059 Cough, unspecified: Secondary | ICD-10-CM

## 2014-12-31 LAB — POCT INFLUENZA A/B
INFLUENZA B, POC: NEGATIVE
Influenza A, POC: NEGATIVE

## 2014-12-31 MED ORDER — BENZONATATE 200 MG PO CAPS: 200.0000 mg | ORAL_CAPSULE | Freq: Three times a day (TID) | ORAL | Status: DC | PRN

## 2014-12-31 MED ORDER — AMITRIPTYLINE HCL 25 MG PO TABS: 25.0000 mg | ORAL_TABLET | Freq: Every day | ORAL | Status: DC

## 2014-12-31 MED ORDER — HYDROCOD POLST-CHLORPHEN POLST 10-8 MG/5ML PO LQCR: 5.0000 mL | Freq: Every evening | ORAL | Status: DC | PRN

## 2014-12-31 NOTE — Progress Notes (Signed)
Subjective:    Patient ID: Ryan Clements, male    DOB: 1965-12-09, 49 y.o.   MRN: [id number]  Synopsis: 49 year old male with allergic rhinitis and upper airway cough syndrome. Has severe, persistent cough after episodes of bronchitis. May 2015 pulmonary function testing was completely normal  HPI   Chief Complaint  Patient presents with  . Acute Visit    pt c/o increased cough, stabbing pain in chest with cough and light yellow mucus production. Pt states that it started 12/29/14 with scratchy throat. Pt reports having possible low grade fever, muscle aches and body aches.    12/31/2014 ROV> Ryan Clements is here to see me for cough.  On the last visit he was still complaining of cough and we recommended the following: - Obtain chest x-ray for completeness sake -Treat postnasal drip with chlorpheniramine, phenylephrine, and Nasacort -Explained at length how Nasacort works and that it needs regular use -Voice rest strongly encouraged -Hard candies, warm beverages encourage for voice rest -Continue Neurontin 300 mg 3 times a day -Add Elavil 25 mg each bedtime, risks and proper use of this medicine were discussed at length. -If no improvement in 4 weeks then we will refer him to the [facility] for behavioral therapy  Since then he was doing OK for a while.  He said that the elavil really made him quit coughing nearly right away.  He thinks that the gabapentin made his face twitch some so he gradually titrated the medication down.  He then stopped taking the reflux medications and by December 1 he was still not coughing so he stopped taking the Elavil.  He didn't have the cough for several weeks until he caught a cold.    However last week he had been out at a funeral and notes that he caught a cold from some one there.  He says that he has been having some chest tight. He coughs up clear to yellow mucus.  He denies fever or chills.  He has not had sinus symptoms. He notes body aches  all over.    Past Medical History  Diagnosis Date  . Hypertension   . Irregular heart rhythm   . Asthma   . Headaches due to old head injury   . Environmental allergies      Review of Systems  Constitutional: Negative for fever, chills and fatigue.  HENT: Negative for postnasal drip, rhinorrhea and sinus pressure.   Respiratory: Positive for cough. Negative for shortness of breath and wheezing.   Cardiovascular: Negative for chest pain, palpitations and leg swelling.       Objective:   Physical Exam Filed Vitals:   12/31/14 1332  BP: 138/84  Pulse: 108  Temp: 97.7 F (36.5 C)  TempSrc: Oral  Height:  (1.702 m)  Weight: 300 lb 12.8 oz (136.442 kg)  SpO2: 97%  Body mass index is 47.1 kg/(m^2).  RA  Gen: morbidly obese, rare cough, no distress HEENT: NCAT,  EOMi, OP clear,  PULM: CTA B CV: RRR, no mgr, cannot assess JVD AB: BS+, soft, nontender Ext: warm, no edema, no clubbing, no cyanosis Derm: no rash or skin breakdown Neuro: A&Ox4, MAEW       Assessment & Plan:   Upper airway cough syndrome Ryan Clements has had recurrence of his chronic cough after a recent bout of a upper respiratory viral infection.  He has proven in the last year that treatment for postnasal drip and acid reflux have very little  effect on his chronic cough which is due primarily to upper airway (laryngeal) irritation. This is been treated most effectively with nighttime Elavil 25 mg.  His lungs are clear and his vital signs are normal so I do not believe he has a bacterial pneumonia.  Plan: -I explained to him today at length that due to his current viral illness he will have a cough with mucus production for several weeks. -Mucinex as needed for sputum production -Tessalon when necessary acutely, Tussionex daily at bedtime acutely -Restart Elavil 25 mg daily at bedtime now, continue indefinitely  Today we spent greater than 25 minutes in consultation regarding his chronic  cough       Updated Medication List Outpatient Encounter Prescriptions as of 12/31/2014  Medication Sig  . amitriptyline (ELAVIL) 25 MG tablet Take 1 tablet (25 mg total) by mouth at bedtime.  . cetirizine (ZYRTEC) 10 MG tablet Take 10 mg by mouth daily. With allergy vaccines  . EPINEPHrine (EPIPEN) 0.3 mg/0.3 mL IJ SOAJ injection Inject 0.3 mg into the muscle as needed.  . [DISCONTINUED] amitriptyline (ELAVIL) 25 MG tablet Take 1 tablet (25 mg total) by mouth at bedtime.  . benzonatate (TESSALON) 200 MG capsule Take 1 capsule (200 mg total) by mouth 3 (three) times daily as needed for cough.  . chlorpheniramine-HYDROcodone (TUSSIONEX PENNKINETIC ER) 10-8 MG/5ML LQCR Take 5 mLs by mouth at bedtime as needed for cough.  Marland Kitchen. Respiratory Therapy Supplies (FLUTTER) DEVI Use as directed (Patient not taking: Reported on 12/31/2014)  . [DISCONTINUED] chlorpheniramine (CHLOR-TRIMETON) 4 MG tablet Take 8 mg by mouth at bedtime.  . [DISCONTINUED] dextromethorphan (DELSYM) 30 MG/5ML liquid 2 tsp every 12 hours as needed  . [DISCONTINUED] famotidine (PEPCID) 20 MG tablet One at bedtime (Patient not taking: Reported on 12/31/2014)  . [DISCONTINUED] gabapentin (NEURONTIN) 300 MG capsule Take 1 capsule (300 mg total) by mouth 3 (three) times daily. (Patient not taking: Reported on 12/31/2014)  . [DISCONTINUED] omeprazole (PRILOSEC) 40 MG capsule Take 40 mg by mouth daily.

## 2014-12-31 NOTE — Patient Instructions (Signed)
Take the Tessalon perles during the day to help with the cough Take the tussionex at night to help with cough Take elavil at night as you were doing before  Take plain mucinex (guaifenesin) without 12 hour tablets (600-1200mg  twice a day) to help loosen up the mucus  Call us if you are worse, have a fever, or become short of breath  We will see you back in 6 months or sooner if needed

## 2015-01-01 NOTE — Assessment & Plan Note (Addendum)
Ryan Clements has had recurrence of his chronic cough after a recent bout of a upper respiratory viral infection.  He has proven in the last year that treatment for postnasal drip and acid reflux have very little effect on his chronic cough which is due primarily to upper airway (laryngeal) irritation. This is been treated most effectively with nighttime Elavil 25 mg.  His lungs are clear and his vital signs are normal so I do not believe he has a bacterial pneumonia.  Plan: -I explained to him today at length that due to his current viral illness he will have a cough with mucus production for several weeks. -Mucinex as needed for sputum production -Tessalon when necessary acutely, Tussionex daily at bedtime acutely -Restart Elavil 25 mg daily at bedtime now, continue indefinitely  Today we spent greater than 25 minutes in consultation regarding his chronic cough

## 2015-01-02 ENCOUNTER — Telehealth: Payer: Self-pay | Admitting: Pulmonary Disease

## 2015-01-02 DIAGNOSIS — R058 Other specified cough: Secondary | ICD-10-CM

## 2015-01-02 DIAGNOSIS — R05 Cough: Secondary | ICD-10-CM

## 2015-01-02 MED ORDER — DOXYCYCLINE HYCLATE 100 MG PO TABS: 100.0000 mg | ORAL_TABLET | Freq: Two times a day (BID) | ORAL | Status: DC

## 2015-01-02 NOTE — Telephone Encounter (Signed)
If he is having a fever this is new, then he needs to have a Chest X-ray as he and I discussed on Monday He will also need an antibiotic: doxycycline 100mg  po bid x7 days, take with yogurt  For the cough, he can take tussionex q12h prn cough, but he cannot take this and drive as it will make him drowsy

## 2015-01-02 NOTE — Telephone Encounter (Signed)
In addition to previous message taken by Morrie SheldonAshley at 10:36 - Spoke with patient again, states that he wanted to clarify his medications for his cough. Pt states that his cough has worsened and he feels that the [employer] Perles are not working. Pt takes Tussionex at night and it seems to help for a while. Wants to know if he can take the Tussionex during the day and if so, what instructions for use? Pt wants other rec's was well for the cough, something in place of the Tessalon as he feels these may not be effective.   Please advise to both messages (see below) Dr Kendrick FriesMcQuaid. Thanks.  Allergies  Allergen Reactions  . Codeine   . Erythromycin

## 2015-01-02 NOTE — Telephone Encounter (Signed)
Spoke with patietn - rec's given per Dr Kendrick FriesMcQuaid.  Pt states that he is unsure what his fever has been running "exactly" because he has been drinking a lot of hot tea. Pt states that he does "know" that he has a fever. I advised him that he needs to make the decision on if her feels the fever is severe enough to take the abx and cxr. Pt states that he feels the fever is present and causing issues making him feel worse and would like the abx. I advised him that he needs to have the chest xray done if he is indeed concerned of the fever. Pt plans to have [facility] either today or tomorrow depending on how he is feeling.  Doxy 100mg  sent to pharmacy. Pt advised on dosing change of Tussionex - expressed understanding. Advised to let us know how he is feeling in the next few days.  Order fax[facility] cxr (F# [phone]) - note on order stating results need to be faxed to [facility] ASAP ATTN: Dr Kendrick FriesMcQuaid.  Nothing further needed.

## 2015-01-02 NOTE — Telephone Encounter (Signed)
Attempted to call pt. No answer, voicemail has not been set up. Will try back. 

## 2015-01-02 NOTE — Telephone Encounter (Signed)
Spoke with pt, saw BQ Monday, states he's only worsened since that visit.  Taking tylenol regularly to keep fever down.  Pt wants to know how often he can take the tussionex more than as needed at night.   Pt also wants to know if he needs an abx since he is worsening since Monday.    BQ please advise.  Thanks!

## 2015-01-03 ENCOUNTER — Ambulatory Visit: Payer: Self-pay | Admitting: Pulmonary Disease

## 2015-01-03 ENCOUNTER — Telehealth: Payer: Self-pay | Admitting: Pulmonary Disease

## 2015-01-03 NOTE — Telephone Encounter (Signed)
CXR report received and place in BQ's look at.  Please advise.

## 2015-01-03 NOTE — Telephone Encounter (Signed)
Spoke with pt and advised of Dr Ulyses JarredMcQuaid's recommendations.  Pt verbalized understanding.  Pt to continue with Doxycycline and if symptoms persist advised to call PCP.

## 2015-01-03 NOTE — Telephone Encounter (Signed)
Spoke with pt.  He states he has had 2 doses of Doxycycline.  Had fever of 103.4 last night.  Called [facility] - CXR done today but still waiting on final report to be faxed.  Will leave in triage to check up front fax machine.

## 2015-01-03 NOTE — Telephone Encounter (Signed)
His lungs were clear when I saw him and the chest x-ray did not show pneumonia.  Because he has been running a fever this high despite the doxycycline and with a normal pulmonary exam on Monday and CXR yestrday, there may be another cause for his fever.  He needs to be seen by his primary care physician.  There may be another non-pulmonary cause of his fever (UTI?).

## 2015-01-15 ENCOUNTER — Ambulatory Visit: Payer: Self-pay | Admitting: Pulmonary Disease

## 2015-01-28 ENCOUNTER — Encounter: Payer: Self-pay | Admitting: Pulmonary Disease

## 2015-07-24 ENCOUNTER — Encounter: Payer: Self-pay | Admitting: Pulmonary Disease

## 2015-07-24 ENCOUNTER — Ambulatory Visit (INDEPENDENT_AMBULATORY_CARE_PROVIDER_SITE_OTHER): Payer: BLUE CROSS/BLUE SHIELD | Admitting: Pulmonary Disease

## 2015-07-24 VITALS — BP 126/84 | HR 81 | Ht 67.0 in | Wt 303.0 lb

## 2015-07-24 DIAGNOSIS — R058 Other specified cough: Secondary | ICD-10-CM

## 2015-07-24 DIAGNOSIS — R05 Cough: Secondary | ICD-10-CM

## 2015-07-24 NOTE — Assessment & Plan Note (Signed)
We have struggled with Dusten's upper airway cough syndrome. We have not identified response to treatment for acid reflux, he does not respond to treatment for postnasal drip, and he has had side effects from many of the medications we have attempted for laryngeal irritation. Specifically, he notes frequent blinking, almost a tic-like response, after taking Neurontin, and he has ongoing drowsiness when he takes Elavil. He has never consistently tried a long period of cough suppressive therapy because of the side effects. He continues to struggle with cough.  I do wonder whether or not some of this is a neurogenic tic type process. I have had another patient with a similar syndrome respond well after seeing neurology in [place].  Plan: Continue Elavil at night, I recommended a half dose to minimize drowsiness I discussed pregabalin today is that has been used in clinical trials to suppress cough for chronic cough syndrome, he is a little reluctant to try that because of the side effect he had from the Neurontin Refer to neurology to see if they have thoughts as to a neurogenic cause (tic?) For his cough If no improvement after seeing neurology then consider going back to [facility] ear nose and throat with Dr. Loralie Champagne

## 2015-07-24 NOTE — Progress Notes (Signed)
Subjective:    Patient ID: Ryan Clements, male    DOB: 1966-10-13, 49 y.o.   MRN: [id number]  Synopsis: 49 year old male with allergic rhinitis and upper airway cough syndrome. Has severe, persistent cough after episodes of bronchitis. May 2015 pulmonary function testing was completely normal  HPI   Chief Complaint  Patient presents with  . Follow-up    Pt states that cough is no better since last visit   Ryan Clements is off of the Elavil now because it was making him so sleepy.  He felt like it really never made the cough go away.  He said that he would take it on Friday night but then by Monday he wasn't making much headway.   He still has to cough a lot. He still has to clear his throat a lot.  Sometimes he tries behavioral techniques to help with the cough, but he still struggle with treatment.  Some days are worse than others.  It is always worse when he gets sick.  He hasn't been sick lately.  He says that he can't really remember a time when this started.  He remembers a time when he was running a lot more and he ran a race in cold weather in 2003 and he had a lot of symptoms afterwards.  He remembers coughing up blood at that time.   He is still coughing. He has not taken the elavil consistently.  Past Medical History  Diagnosis Date  . Hypertension   . Irregular heart rhythm   . Asthma   . Headaches due to old head injury   . Environmental allergies      Review of Systems  Constitutional: Negative for fever, chills and fatigue.  HENT: Negative for postnasal drip, rhinorrhea and sinus pressure.   Respiratory: Positive for cough. Negative for shortness of breath and wheezing.   Cardiovascular: Negative for chest pain, palpitations and leg swelling.       Objective:   Physical Exam Filed Vitals:   07/24/15 1609  BP: 126/84  Pulse: 81  Height:  (1.702 m)  Weight: 303 lb (137.44 kg)  SpO2: 97%  Body mass index is 47.45 kg/(m^2).  RA  Gen: morbidly obese, rare  cough, no distress HEENT: NCAT,  EOMi, OP clear,  PULM: CTA B CV: RRR, no mgr, cannot assess JVD AB: BS+, soft, nontender Ext: warm, no edema, no clubbing, no cyanosis Derm: no rash or skin breakdown Neuro: A&Ox4, MAEW       Assessment & Plan:   Upper airway cough syndrome We have struggled with Burdette's upper airway cough syndrome. We have not identified response to treatment for acid reflux, he does not respond to treatment for postnasal drip, and he has had side effects from many of the medications we have attempted for laryngeal irritation. Specifically, he notes frequent blinking, almost a tic-like response, after taking Neurontin, and he has ongoing drowsiness when he takes Elavil. He has never consistently tried a long period of cough suppressive therapy because of the side effects. He continues to struggle with cough.  I do wonder whether or not some of this is a neurogenic tic type process. I have had another patient with a similar syndrome respond well after seeing neurology in [place].  Plan: Continue Elavil at night, I recommended a half dose to minimize drowsiness I discussed pregabalin today is that has been used in clinical trials to suppress cough for chronic cough syndrome, he is a little reluctant to try  that because of the side effect he had from the Neurontin Refer to neurology to see if they have thoughts as to a neurogenic cause (tic?) For his cough If no improvement after seeing neurology then consider going back to [facility] ear nose and throat with Dr. Loralie Champagne   > 25 minutes spent in today's visit, > 20 minutes in face to face conversation Updated Medication List Outpatient Encounter Prescriptions as of 07/24/2015  Medication Sig  . cetirizine (ZYRTEC) 10 MG tablet Take 10 mg by mouth daily. With allergy vaccines  . amitriptyline (ELAVIL) 25 MG tablet Take 1 tablet (25 mg total) by mouth at bedtime. (Patient not taking: Reported on 07/24/2015)    . benzonatate (TESSALON) 200 MG capsule Take 1 capsule (200 mg total) by mouth 3 (three) times daily as needed for cough. (Patient not taking: Reported on 07/24/2015)  . chlorpheniramine-HYDROcodone (TUSSIONEX PENNKINETIC ER) 10-8 MG/5ML LQCR Take 5 mLs by mouth at bedtime as needed for cough. (Patient not taking: Reported on 07/24/2015)  . doxycycline (VIBRA-TABS) 100 MG tablet Take 1 tablet (100 mg total) by mouth 2 (two) times daily. Take with yogurt daily. (Patient not taking: Reported on 07/24/2015)  . EPINEPHrine (EPIPEN) 0.3 mg/0.3 mL IJ SOAJ injection Inject 0.3 mg into the muscle as needed.  Marland Kitchen Respiratory Therapy Supplies (FLUTTER) DEVI Use as directed (Patient not taking: Reported on 12/31/2014)   No facility-administered encounter medications on file as of 07/24/2015.

## 2015-07-24 NOTE — Patient Instructions (Signed)
I am going to refer you to a neurologist in [place] and Dr. Clelia Croft for evaluation of your ongoing cough If you are not better after seeing Dr. Clelia Croft with me know and I can refer you to Dr. Loralie Champagne in [facility] Use Elavil at night as needed for cough, try taking a half a tablet Follow-up with [place] if symptoms worsen

## 2015-11-12 ENCOUNTER — Other Ambulatory Visit: Payer: Self-pay | Admitting: Pulmonary Disease

## 2015-11-22 ENCOUNTER — Other Ambulatory Visit: Payer: Self-pay | Admitting: Neurology

## 2015-11-22 DIAGNOSIS — R053 Chronic cough: Secondary | ICD-10-CM

## 2015-11-22 DIAGNOSIS — R05 Cough: Secondary | ICD-10-CM

## 2015-12-11 ENCOUNTER — Ambulatory Visit: Payer: BLUE CROSS/BLUE SHIELD

## 2015-12-12 ENCOUNTER — Other Ambulatory Visit: Payer: Self-pay | Admitting: Pulmonary Disease

## 2016-01-18 ENCOUNTER — Inpatient Hospital Stay: Admission: RE | Admit: 2016-01-18 | Payer: BLUE CROSS/BLUE SHIELD | Source: Ambulatory Visit

## 2016-01-24 ENCOUNTER — Ambulatory Visit
Admission: RE | Admit: 2016-01-24 | Discharge: 2016-01-24 | Disposition: A | Discharge status: Home / Self Care | Payer: BLUE CROSS/BLUE SHIELD | Source: Ambulatory Visit | Attending: Neurology | Admitting: Neurology

## 2016-01-24 ENCOUNTER — Other Ambulatory Visit: Payer: Self-pay | Admitting: Neurology

## 2016-01-24 DIAGNOSIS — R053 Chronic cough: Secondary | ICD-10-CM

## 2016-01-24 DIAGNOSIS — R05 Cough: Secondary | ICD-10-CM

## 2016-01-24 MED ORDER — GADOBENATE DIMEGLUMINE 529 MG/ML IV SOLN
20.0000 mL | Freq: Once | INTRAVENOUS | Status: AC | PRN
Start: 2016-01-24 — End: 2016-01-24
  Administered 2016-01-24: 20 mL via INTRAVENOUS

## 2016-02-04 ENCOUNTER — Other Ambulatory Visit: Payer: BLUE CROSS/BLUE SHIELD

## 2017-01-20 ENCOUNTER — Ambulatory Visit: Payer: Self-pay | Admitting: Medical

## 2017-01-20 VITALS — BP 130/78 | HR 82 | Temp 97.9°F | Resp 16 | Ht 67.0 in | Wt 312.0 lb

## 2017-01-20 DIAGNOSIS — R0982 Postnasal drip: Secondary | ICD-10-CM

## 2017-01-20 DIAGNOSIS — J029 Acute pharyngitis, unspecified: Secondary | ICD-10-CM

## 2017-01-20 DIAGNOSIS — H6983 Other specified disorders of Eustachian tube, bilateral: Secondary | ICD-10-CM

## 2017-01-20 LAB — POCT RAPID STREP A (OFFICE): Rapid Strep A Screen: NEGATIVE

## 2017-01-20 NOTE — Progress Notes (Signed)
   Subjective:    Patient ID: Ryan Clements, male    DOB: 10/20/1966, 51 y.o.   MRN: [id number]    HPI  Woke up on Monday with sore throat, thought he might have a low grade fever yesterday ( though he did not take his temperature). Woke up with sore throat worse today. Would like to be check for strep throat. He says he has fired all his doctors but me.  He has stopped all his medications including his zyrtec.        Review of Systems  Constitutional: Positive for fever. Negative for chills.  HENT: Positive for sore throat. Negative for ear pain, rhinorrhea, sinus pain and sinus pressure.   Eyes: Negative.   Respiratory: Negative.   Cardiovascular: Negative.   Hematological: Negative for adenopathy.       Objective:   Physical Exam  Constitutional: He appears well-developed and well-nourished.  HENT:  Head: Normocephalic and atraumatic.  Right Ear: A middle ear effusion is present.  Left Ear: A middle ear effusion is present.  Nose: Nose normal.  Mouth/Throat: Uvula is midline. Posterior oropharyngeal erythema present.    Eyes: EOM are normal. Pupils are equal, round, and reactive to light.  Neck: Normal range of motion. Neck supple.  Cardiovascular: Normal rate, regular rhythm and normal heart sounds.  Exam reveals no gallop and no friction rub.   No murmur heard. Pulmonary/Chest: Effort normal and breath sounds normal.    Uvula swollen , erythema and vascular looking.   Strep test negative     Assessment & Plan:  Pharyngitis - most likely from post nasal drip , Eustachian tube dysfunction bilateral, restart otc zyrtec daily. For now use otc motrin as take as directed as needed for pain. Dilute salt water gargles 3-4 times per day. Contact me by Friday if worsening.

## 2017-05-04 ENCOUNTER — Ambulatory Visit: Payer: Self-pay

## 2017-05-04 VITALS — Temp 98.7°F

## 2017-05-04 DIAGNOSIS — Z23 Encounter for immunization: Secondary | ICD-10-CM

## 2017-07-28 ENCOUNTER — Telehealth: Payer: Self-pay

## 2017-07-28 NOTE — Telephone Encounter (Signed)
Patient calls requesting last weight. Notified of weight 312.

## 2018-01-05 ENCOUNTER — Other Ambulatory Visit: Payer: Self-pay

## 2018-01-05 DIAGNOSIS — R7989 Other specified abnormal findings of blood chemistry: Secondary | ICD-10-CM

## 2018-01-05 DIAGNOSIS — E538 Deficiency of other specified B group vitamins: Secondary | ICD-10-CM

## 2018-01-05 DIAGNOSIS — Z0000 Encounter for general adult medical examination without abnormal findings: Secondary | ICD-10-CM

## 2018-01-06 ENCOUNTER — Other Ambulatory Visit: Payer: Self-pay

## 2018-01-06 DIAGNOSIS — R7989 Other specified abnormal findings of blood chemistry: Secondary | ICD-10-CM

## 2018-01-06 DIAGNOSIS — Z0000 Encounter for general adult medical examination without abnormal findings: Secondary | ICD-10-CM

## 2018-01-06 DIAGNOSIS — E538 Deficiency of other specified B group vitamins: Secondary | ICD-10-CM

## 2018-01-08 LAB — URINALYSIS, ROUTINE W REFLEX MICROSCOPIC
Bilirubin, UA: NEGATIVE
GLUCOSE, UA: NEGATIVE
Ketones, UA: NEGATIVE
Leukocytes, UA: NEGATIVE
Nitrite, UA: NEGATIVE
PH UA: 7 (ref 5.0–7.5)
Protein, UA: NEGATIVE
RBC, UA: NEGATIVE
Specific Gravity, UA: 1.016 (ref 1.005–1.030)
Urobilinogen, Ur: 0.2 mg/dL (ref 0.2–1.0)

## 2018-01-08 LAB — COMPREHENSIVE METABOLIC PANEL
ALBUMIN: 4.6 g/dL (ref 3.5–5.5)
ALT: 15 IU/L (ref 0–44)
AST: 18 IU/L (ref 0–40)
Albumin/Globulin Ratio: 2.1 (ref 1.2–2.2)
Alkaline Phosphatase: 67 IU/L (ref 39–117)
BUN / CREAT RATIO: 14 (ref 9–20)
BUN: 14 mg/dL (ref 6–24)
Bilirubin Total: 0.5 mg/dL (ref 0.0–1.2)
CALCIUM: 9.9 mg/dL (ref 8.7–10.2)
CO2: 27 mmol/L (ref 20–29)
CREATININE: 1 mg/dL (ref 0.76–1.27)
Chloride: 103 mmol/L (ref 96–106)
GFR calc Af Amer: 102 mL/min/{1.73_m2} (ref >59)
GFR, EST NON AFRICAN AMERICAN: 88 mL/min/{1.73_m2} (ref >59)
Globulin, Total: 2.2 g/dL (ref 1.5–4.5)
Glucose: 83 mg/dL (ref 65–99)
Potassium: 4.3 mmol/L (ref 3.5–5.2)
Sodium: 142 mmol/L (ref 134–144)
Total Protein: 6.8 g/dL (ref 6.0–8.5)

## 2018-01-08 LAB — PSA: Prostate Specific Ag, Serum: 1.3 ng/mL (ref 0.0–4.0)

## 2018-01-08 LAB — CBC WITH DIFFERENTIAL/PLATELET
BASOS: 1 %
Basophils Absolute: 0 10*3/uL (ref 0.0–0.2)
EOS (ABSOLUTE): 0.1 10*3/uL (ref 0.0–0.4)
EOS: 2 %
Hematocrit: 43.5 % (ref 37.5–51.0)
Hemoglobin: 14.6 g/dL (ref 13.0–17.7)
Immature Grans (Abs): 0 10*3/uL (ref 0.0–0.1)
Immature Granulocytes: 0 %
LYMPHS ABS: 1.4 10*3/uL (ref 0.7–3.1)
Lymphs: 33 %
MCH: 29.7 pg (ref 26.6–33.0)
MCHC: 33.6 g/dL (ref 31.5–35.7)
MCV: 89 fL (ref 79–97)
MONOS ABS: 0.2 10*3/uL (ref 0.1–0.9)
Monocytes: 6 %
NEUTROS ABS: 2.5 10*3/uL (ref 1.4–7.0)
Neutrophils: 58 %
Platelets: 188 10*3/uL (ref 150–379)
RBC: 4.91 x10E6/uL (ref 4.14–5.80)
RDW: 14.3 % (ref 12.3–15.4)
WBC: 4.2 10*3/uL (ref 3.4–10.8)

## 2018-01-08 LAB — LIPID PANEL
CHOLESTEROL TOTAL: 199 mg/dL (ref 100–199)
Chol/HDL Ratio: 3 ratio (ref 0.0–5.0)
HDL: 67 mg/dL (ref >39)
LDL CALC: 120 mg/dL — AB (ref 0–99)
TRIGLYCERIDES: 62 mg/dL (ref 0–149)
VLDL Cholesterol Cal: 12 mg/dL (ref 5–40)

## 2018-01-08 LAB — VITAMIN D 25 HYDROXY (VIT D DEFICIENCY, FRACTURES): VIT D 25 HYDROXY: 37.7 ng/mL (ref 30.0–100.0)

## 2018-01-08 LAB — TSH: TSH: 3.44 u[IU]/mL (ref 0.450–4.500)

## 2018-01-08 LAB — B12 AND FOLATE PANEL
Folate: 16.3 ng/mL (ref >3.0)
Vitamin B-12: 375 pg/mL (ref 232–1245)

## 2018-01-13 ENCOUNTER — Telehealth: Payer: Self-pay

## 2018-01-13 NOTE — Telephone Encounter (Signed)
Pt called concerned about the reason he hasn't received his lab results from a week ago. Dr. Hyacinth MeekerMiller is his primary doctor and results should have been sent to him.  We contacted Dr. Rondel BatonMiller's office and spoke with the receptionist.  The results are on Dr. Garen LahMillers desk waiting to be signed by him.  The patient was instructed to follow up with Dr Hyacinth MeekerMiller this afternoon to check on results.

## 2018-07-19 ENCOUNTER — Ambulatory Visit: Payer: Self-pay

## 2019-07-18 ENCOUNTER — Other Ambulatory Visit: Payer: Self-pay | Admitting: Orthopedic Surgery

## 2019-07-18 DIAGNOSIS — M25432 Effusion, left wrist: Secondary | ICD-10-CM

## 2019-07-18 DIAGNOSIS — M25532 Pain in left wrist: Secondary | ICD-10-CM

## 2019-07-18 DIAGNOSIS — M659 Synovitis and tenosynovitis, unspecified: Secondary | ICD-10-CM

## 2019-07-26 ENCOUNTER — Ambulatory Visit: Payer: Self-pay

## 2019-07-26 ENCOUNTER — Other Ambulatory Visit: Payer: Self-pay

## 2019-07-26 DIAGNOSIS — Z23 Encounter for immunization: Secondary | ICD-10-CM

## 2019-08-04 ENCOUNTER — Ambulatory Visit
Admission: RE | Admit: 2019-08-04 | Discharge: 2019-08-04 | Disposition: A | Discharge status: Home / Self Care | Payer: BC Managed Care – PPO | Source: Ambulatory Visit | Attending: Orthopedic Surgery | Admitting: Orthopedic Surgery

## 2019-08-04 ENCOUNTER — Other Ambulatory Visit: Payer: Self-pay

## 2019-08-04 DIAGNOSIS — M659 Synovitis and tenosynovitis, unspecified: Secondary | ICD-10-CM | POA: Insufficient documentation

## 2019-08-04 DIAGNOSIS — M25532 Pain in left wrist: Secondary | ICD-10-CM | POA: Diagnosis present

## 2019-08-04 DIAGNOSIS — M25432 Effusion, left wrist: Secondary | ICD-10-CM | POA: Insufficient documentation

## 2019-10-04 ENCOUNTER — Other Ambulatory Visit: Payer: Self-pay | Admitting: Internal Medicine

## 2019-10-04 DIAGNOSIS — M5010 Cervical disc disorder with radiculopathy, unspecified cervical region: Secondary | ICD-10-CM

## 2019-10-17 ENCOUNTER — Ambulatory Visit: Admission: RE | Admit: 2019-10-17 | Payer: BC Managed Care – PPO | Source: Ambulatory Visit

## 2019-10-17 ENCOUNTER — Other Ambulatory Visit: Payer: Self-pay

## 2021-05-25 IMAGING — MR MR WRIST*L* W/O CM
11 series · 40 of 40 positions shown · non-contrast
Comparison: None.

CLINICAL DATA: Base of thumb pain

EXAM:
MR OF THE LEFT WRIST WITHOUT CONTRAST
TECHNIQUE: Multiplanar, multisequence MR imaging of the left wrist was
performed. No intravenous contrast was administered.

[Series 5: T2 fat-sat · axial · left · 2.0mm · 0.47mm/px · z∈[-38,+29]mm · 4 of 29 slices shown (1 of 2)]
[im 1/29]
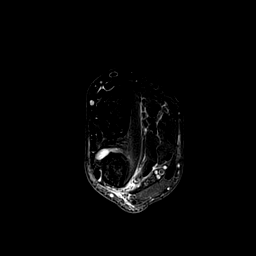
[im 10/29]
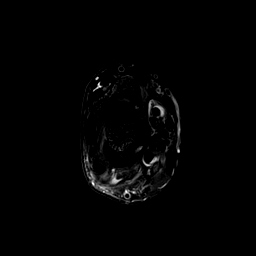
[im 19/29]
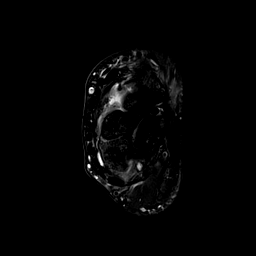
[im 29/29]
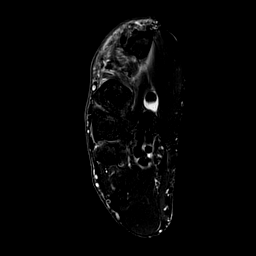

[Series 6: T1 · axial · left · 2.0mm · 0.47mm/px · z∈[-38,+29]mm · 4 of 29 slices shown (1 of 3)]
[im 1/29]
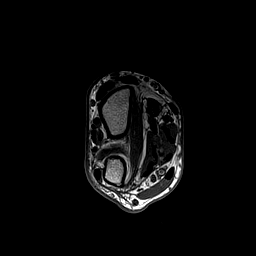
[im 10/29]
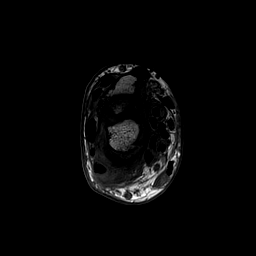
[im 19/29]
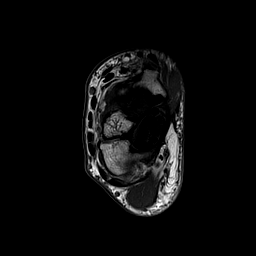
[im 29/29]
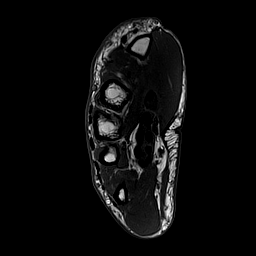

[Series 7: T1 · sagittal · left · 3.0mm · 0.39mm/px · 3 of 15 slices shown (2 of 3)]
[im 1/15]
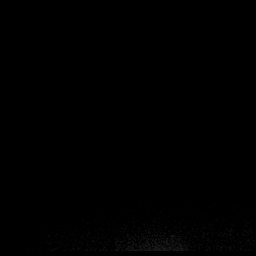
[im 8/15]
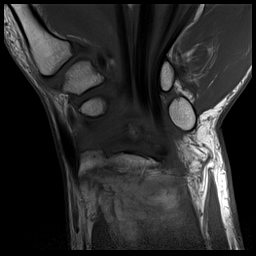
[im 15/15]
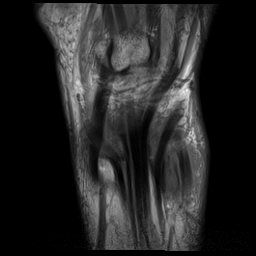

[Series 8: T2 fat-sat · sagittal · left · 3.0mm · 0.39mm/px · 3 of 15 slices shown (2 of 2)]
[im 1/15]
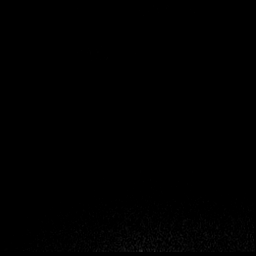
[im 8/15]
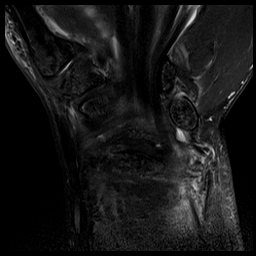
[im 15/15]
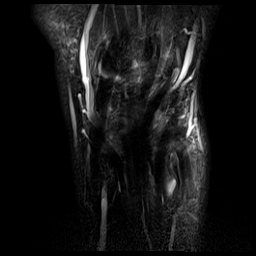

[Series 9: PD fat-sat · sagittal · left · 3.0mm · 0.39mm/px · 3 of 15 slices shown (1 of 2)]
[im 1/15]
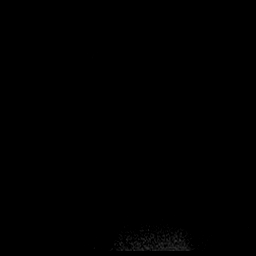
[im 8/15]
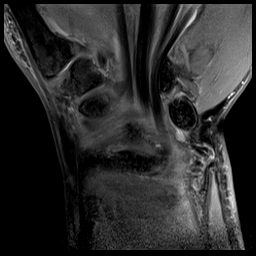
[im 15/15]
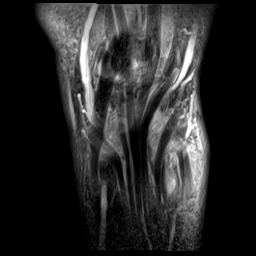

[Series 10: PD fat-sat · coronal · left · 3.0mm · 0.39mm/px · 4 of 21 slices shown (2 of 2)]
[im 1/21]
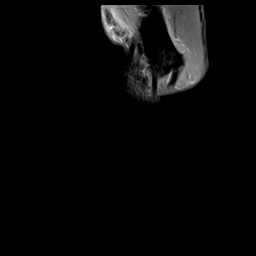
[im 7/21]
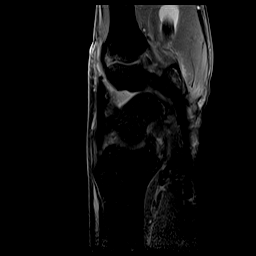
[im 14/21]
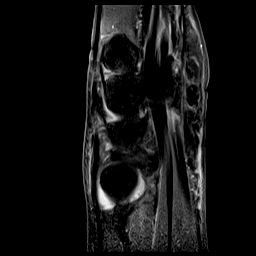
[im 21/21]
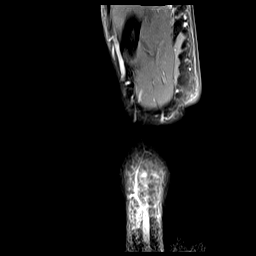

[Series 1061: T2 · axial · left · 2.0mm · 0.47mm/px · z∈[-38,+29]mm · 5 of 29 slices shown (1 of 2)]
[im 1/29]
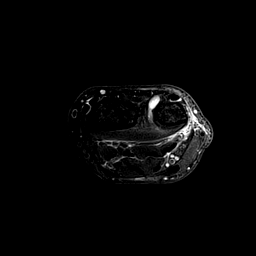
[im 8/29]
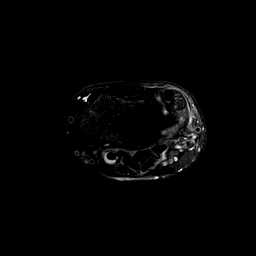
[im 15/29]
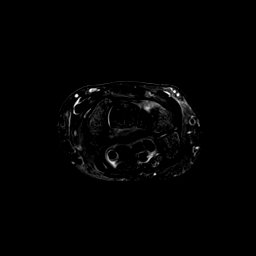
[im 22/29]
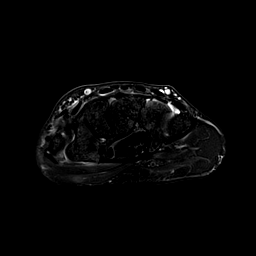
[im 29/29]
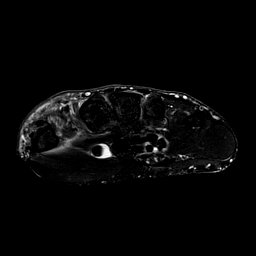

[Series 1068: ax t1_new · axial · left · 2.0mm · 0.47mm/px · z∈[-38,+29]mm · 5 of 29 slices shown]
[im 1/29]
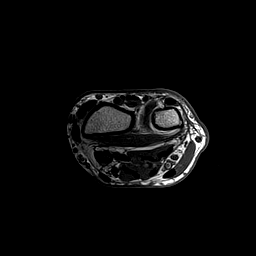
[im 8/29]
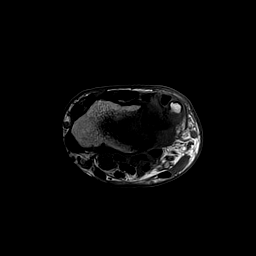
[im 15/29]
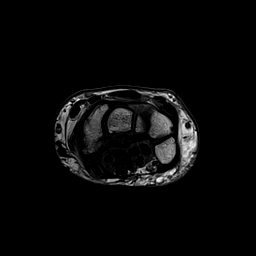
[im 22/29]
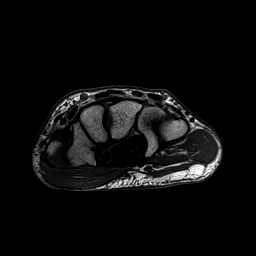
[im 29/29]
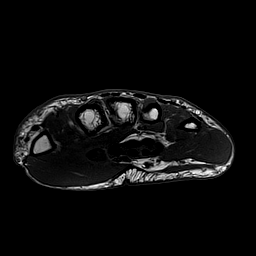

[Series 1075: T1 · sagittal · left · 3.0mm · 0.39mm/px · 3 of 15 slices shown (3 of 3)]
[im 1/15]
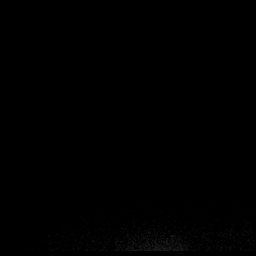
[im 8/15]
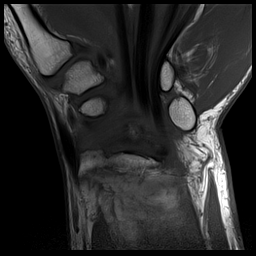
[im 15/15]
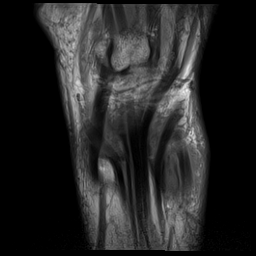

[Series 1082: T2 · sagittal · left · 3.0mm · 0.39mm/px · 3 of 15 slices shown (2 of 2)]
[im 1/15]
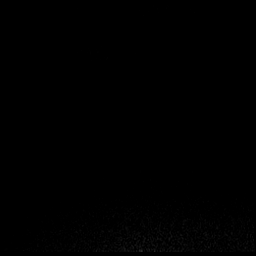
[im 8/15]
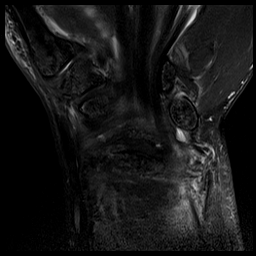
[im 15/15]
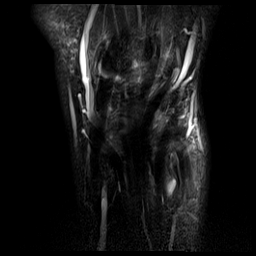

[Series 1089: PD · sagittal · left · 3.0mm · 0.39mm/px · 3 of 15 slices shown]
[im 1/15]
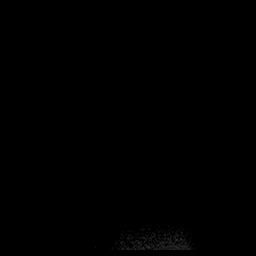
[im 8/15]
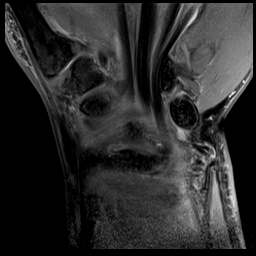
[im 15/15]
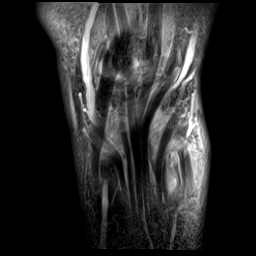

[40 of 40 positions shown; findings below may reference images not displayed]

FINDINGS: Ligaments: The scapholunate and lunotriquetral ligaments appear
grossly intact.

Triangular fibrocartilage: TFC appears mildly thickened and
heterogeneous without discrete tear.

Tendons: Extensor tendons appear intact. Trace fluid within the
extensor compartment 2 tendon sheath. There is fluid within the
visualized flexor pollicis longus tendon sheath. Flexor tendons
intact.

Carpal tunnel/median nerve: Mild edema within the low carpal tunnel.
Subtle bowing of the flexor retinaculum. Normal appearance of the
median nerve.

Guyon's canal: Normal.

Joint/cartilage: No well-defined cartilage defect. Small amount of
joint fluid and synovitis within the distal radioulnar joint.

Bones/carpal alignment: Small focus of marrow edema within the ulnar
aspect of the proximal lunate, less pronounced compared to remote
prior MRI. There is a small focus of marrow edema within the distal
scaphoid pole (series 8274, image 7) which could represent reactive
subchondral marrow change. No fracture.

Other: Partially visualized soft tissue edema at the proximal thumb
as well as intramuscular edema within the first dorsal interosseous
muscle (series 6626, image 1).
IMPRESSION: 1. Flexor pollicis longus tenosynovitis.
2. Soft tissue and intramuscular edema at the proximal thumb,
partially imaged at the edge of the field of view. If concern for
soft tissue injury of the thumb, consider dedicated left thumb MRI.
3. Findings which raise the possibility for carpal tunnel syndrome.
4. Trace extensor compartment 2 tenosynovitis.
5. TFC degeneration without discrete tear.
6. Mild synovitis within the DRUJ.

## 2021-07-16 ENCOUNTER — Other Ambulatory Visit: Payer: Self-pay

## 2021-07-16 ENCOUNTER — Ambulatory Visit: Payer: Self-pay

## 2021-07-16 DIAGNOSIS — Z23 Encounter for immunization: Secondary | ICD-10-CM

## 2023-06-07 ENCOUNTER — Encounter: Payer: Self-pay | Admitting: Sports Medicine

## 2023-06-07 ENCOUNTER — Ambulatory Visit (INDEPENDENT_AMBULATORY_CARE_PROVIDER_SITE_OTHER): Payer: BC Managed Care – PPO | Admitting: Sports Medicine

## 2023-06-07 DIAGNOSIS — M65352 Trigger finger, left little finger: Secondary | ICD-10-CM

## 2023-06-07 MED ORDER — BETAMETHASONE SOD PHOS & ACET 6 (3-3) MG/ML IJ SUSP
6.0000 mg | INTRAMUSCULAR | Status: AC | PRN
Start: 2023-06-07 — End: 2023-06-07
  Administered 2023-06-07: 6 mg via INTRA_ARTICULAR

## 2023-06-07 MED ORDER — LIDOCAINE HCL 1 % IJ SOLN
1.0000 mL | INTRAMUSCULAR | Status: AC | PRN
Start: 2023-06-07 — End: 2023-06-07
  Administered 2023-06-07: 1 mL

## 2023-06-07 NOTE — Progress Notes (Signed)
Ryan Clements - 57 y.o. male MRN [id number]  Date of birth: [birth year]  Office Visit Note: Visit Date: 06/07/2023 PCP: Danella Penton, MD Referred by: Danella Penton, MD  Subjective: No chief complaint on file.  HPI: Ryan Clements is a pleasant 57 y.o. male who presents today for left pinky finger trigger finger.  He has been dealing with triggering episodes of the left little finger, has been on and off for the last year or 2.  Has been worse as of late.  He is very active in the gym and does note with gripping activities this will cause it to be worse.  The pain and triggering is worse at night as well as first thing in the morning.  Has never had an injection or surgical intervention to date.  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  Pertinent ROS were reviewed with the patient and found to be negative unless otherwise specified above in HPI.   Assessment & Plan: Visit Diagnoses:  1. Trigger finger, left little finger    Plan: Discussed with Taylin the nature of his trigger finger which is likely from repetitive activity, gripping activity from the gym, etc.  Discussed treatment options, elected to proceed with trigger finger injection, patient tolerated well.  We did apply a Band-Aid splint to the IP joint of the index finger, he is to keep this in place for the next 72 hours.  He may use ice, Tylenol or over-the-counter anti-inflammatories as needed.  He will avoid repetitive gripping activities for the next 1 week, then may return to activity without restriction.  I would like to see him back in about 1 month, if he had 100% relief of any triggering episodes we will let him be, if he is having some recurrence we will consider additional trigger finger injection.  Follow-up: Return in about 1 month (around 07/08/2023) for pinky finger trigger.   Meds & Orders: No orders of the defined types were placed in this  encounter.   Orders Placed This Encounter  Procedures   Hand/UE Inj     Procedures: Hand/UE Inj: L small A1 for trigger finger on 06/07/2023 2:04 PM Indications: pain and tendon swelling Details: 25 G needle, volar approach Medications: 1 mL lidocaine 1 %; 6 mg betamethasone acetate-betamethasone sodium phosphate 6 (3-3) MG/ML Outcome: tolerated well, no immediate complications Procedure, treatment alternatives, risks and benefits explained, specific risks discussed. Consent was given by the patient. Immediately prior to procedure a time out was called to verify the correct patient, procedure, equipment, support staff and site/side marked as required. Patient was prepped and draped in the usual sterile fashion.          Clinical History: No specialty comments available.  He reports that he has never smoked. He has never used smokeless tobacco. No results for input(s): "HGBA1C", "LABURIC" in the last 8760 hours.  Objective:   Vital Signs: There were no vitals taken for this visit.  Physical Exam  Gen: Well-appearing, in no acute distress; non-toxic CV:  Well-perfused. Warm.  Resp: Breathing unlabored on room air; no wheezing. Psych: Fluid speech in conversation; appropriate affect; normal thought process Neuro: Sensation intact throughout. No gross coordination deficits.   Ortho Exam - Left hand: + TTP with thickening over the A1 pulley of the little finger of the left hand.  This is tender to palpation.  No passive reproducible clicking on examination today.  Cannot palpate  any palmar thickening or Dupuytren's contracture.  Imaging:  MR WRIST LEFT WO CONTRAST CLINICAL DATA:  Base of thumb pain  EXAM: MR OF THE LEFT WRIST WITHOUT CONTRAST  TECHNIQUE: Multiplanar, multisequence MR imaging of the left wrist was performed. No intravenous contrast was administered.  COMPARISON:  None.  FINDINGS: Ligaments: The scapholunate and lunotriquetral ligaments appear grossly  intact.  Triangular fibrocartilage: TFC appears mildly thickened and heterogeneous without discrete tear.  Tendons: Extensor tendons appear intact. Trace fluid within the extensor compartment 2 tendon sheath. There is fluid within the visualized flexor pollicis longus tendon sheath. Flexor tendons intact.  Carpal tunnel/median nerve: Mild edema within the low carpal tunnel. Subtle bowing of the flexor retinaculum. Normal appearance of the median nerve.  Guyon's canal: Normal.  Joint/cartilage: No well-defined cartilage defect. Small amount of joint fluid and synovitis within the distal radioulnar joint.  Bones/carpal alignment: Small focus of marrow edema within the ulnar aspect of the proximal lunate, less pronounced compared to remote prior MRI. There is a small focus of marrow edema within the distal scaphoid pole (series 1082, image 7) which could represent reactive subchondral marrow change. No fracture.  Other: Partially visualized soft tissue edema at the proximal thumb as well as intramuscular edema within the first dorsal interosseous muscle (series 1061, image 1).  IMPRESSION: 1. Flexor pollicis longus tenosynovitis. 2. Soft tissue and intramuscular edema at the proximal thumb, partially imaged at the edge of the field of view. If concern for soft tissue injury of the thumb, consider dedicated left thumb MRI. 3. Findings which raise the possibility for carpal tunnel syndrome. 4. Trace extensor compartment 2 tenosynovitis. 5. 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 6. Mild synovitis within the DRUJ.  Electronically Signed   By: Duanne Guess M.D.   On: 08/04/2019 15:18    Past Medical/Family/Surgical/Social History: Medications & Allergies reviewed per EMR, new medications updated. Patient Active Problem List   Diagnosis Date Noted   Upper airway cough syndrome 03/05/2014   Allergic rhinitis 03/05/2014   GERD (gastroesophageal reflux disease) 03/05/2014    Past Medical History:  Diagnosis Date   Asthma    Environmental allergies    Headaches due to old head injury    Hypertension    Irregular heart rhythm    Family History  Problem Relation Age of Onset   Allergies Mother    Asthma Mother    Heart disease Maternal Grandfather    Heart disease Paternal Uncle    Heart disease Maternal Uncle    Heart disease Maternal Aunt    Cancer Maternal Aunt        brain   Cancer Maternal Grandmother        lung   Social History   Occupational History   Not on file  Tobacco Use   Smoking status: Never   Smokeless tobacco: Never  Substance and Sexual Activity   Alcohol use: Yes    Comment: occasional wine or beer   Drug use: No   Sexual activity: Not on file

## 2023-07-08 ENCOUNTER — Encounter: Payer: Self-pay | Admitting: Sports Medicine

## 2023-07-08 ENCOUNTER — Ambulatory Visit (INDEPENDENT_AMBULATORY_CARE_PROVIDER_SITE_OTHER): Payer: BC Managed Care – PPO | Admitting: Sports Medicine

## 2023-07-08 VITALS — Ht 68.0 in | Wt 165.0 lb

## 2023-07-08 DIAGNOSIS — M65352 Trigger finger, left little finger: Secondary | ICD-10-CM

## 2023-07-08 DIAGNOSIS — S76211A Strain of adductor muscle, fascia and tendon of right thigh, initial encounter: Secondary | ICD-10-CM

## 2023-07-08 MED ORDER — LIDOCAINE HCL 1 % IJ SOLN
1.0000 mL | INTRAMUSCULAR | Status: AC | PRN
Start: 2023-07-08 — End: 2023-07-08
  Administered 2023-07-08: 1 mL

## 2023-07-08 MED ORDER — BETAMETHASONE SOD PHOS & ACET 6 (3-3) MG/ML IJ SUSP
6.0000 mg | INTRAMUSCULAR | Status: AC | PRN
Start: 2023-07-08 — End: 2023-07-08
  Administered 2023-07-08: 6 mg via INTRA_ARTICULAR

## 2023-07-08 NOTE — Progress Notes (Signed)
Patient says that 3 days after his injection, when he took the bandaging and splint off, he did not notice much of a difference in his finger - he still had pain and clicking. He states that in the weeks since then, he has stopped having clicking and only notices it when somebody asks how it is.

## 2023-07-08 NOTE — Progress Notes (Signed)
Ryan Clements - 57 y.o. male MRN [id number]  Date of birth: [birth year]  Office Visit Note: Visit Date: 07/08/2023 PCP: Danella Penton, MD Referred by: Danella Penton, MD  Subjective: Chief Complaint  Patient presents with   Right Hand - Follow-up   HPI: Ryan Clements is a pleasant 57 y.o. male who presents today for follow-up of right pinky finger trigger finger. Also with R-groin injury/pain.  We did see him for this 1 month ago, did proceed with trigger finger injection.  Had some soreness and still some triggering for the first week or so but then incidentally noted his triggering had essentially completely resolved.  Still has some mild tenderness over the following but is significantly improved.  Has returned to lifting and activity.  A week ago on Saturday he was performing leg press in the gym and he felt a sharp pain/pop in the inner groin.  He had significant pain for the first 4-5 days but this has started to slightly improved.  He denies any swelling or bruising over the area.  Has been able to get back into the gym but is avoiding aggravating activities.  He is taking etodolac for pain 1-2 daily PRN.  Pertinent ROS were reviewed with the patient and found to be negative unless otherwise specified above in HPI.   Assessment & Plan: Visit Diagnoses:  1. Trigger finger, left little finger   2. Inguinal strain, right, initial encounter    Plan: Ryan Clements made improvements with his left pinky finger trigger finger, although still has some mild catching during my examination today.  Given this, we did repeat 1 additional trigger finger injection in attempt to completely resolve his triggering and pain.  Band-Aid splint applied he will keep this in place for the next 72 hours.  May use ice, Tylenol over-the-counter anti-inflammatories as needed.  He also recently injured his right groin with the weight lifting activity with leg press.  No bruising noted on exam, his exam is more  indicative of a strain.  Discussed conservative measures, avoidance of activity.  Okay for gentle stretching as his pain improves.  He is taking etodolac twice daily as needed, he may continue this but would like him to discontinue this over the next 2 weeks to prevent side effect profile.  If he is not largely improved after 1 month he may follow-up for that but otherwise can follow-up as needed.  Follow-up: Return if symptoms worsen or fail to improve.   Meds & Orders: No orders of the defined types were placed in this encounter.   Orders Placed This Encounter  Procedures   Hand/UE Inj: L small A1     Procedures: Hand/UE Inj: L small A1 for trigger finger on 07/08/2023 1:56 PM Indications: pain and tendon swelling Details: 25 G needle, volar approach Medications: 1 mL lidocaine 1 %; 6 mg betamethasone acetate-betamethasone sodium phosphate 6 (3-3) MG/ML Outcome: tolerated well, no immediate complications  Trigger finger injection, left pinky finger After discussion on risks/benefits/indications and informed verbal consent was obtained, a timeout was performed. The area of tenderness at the A1 pulley was identified with palpable nodule located and marked. This area was cleaned with Betadine swab and alcohol swabs. After sterile precautions were taken, a 25-gauge, 5/8" needle was inserted into the A1 pulley with 1.0cc:1.0cc mixture of Lidocaine 1% and Betamethasone 6mg /mL. Patient tolerated procedure well and was observed for at least 5 minutes after injection with resolution of pain and improved ROM.  Procedure, treatment alternatives, risks and benefits explained, specific risks discussed. Consent was given by the patient. Immediately prior to procedure a time out was called to verify the correct patient, procedure, equipment, support staff and site/side marked as required. Patient was prepped and draped in the usual sterile fashion.          Clinical History: No specialty comments  available.  He reports that he has never smoked. He has never used smokeless tobacco. No results for input(s): "HGBA1C", "LABURIC" in the last 8760 hours.  Objective:    Physical Exam  Gen: Well-appearing, in no acute distress; non-toxic CV: Well-perfused. Warm.  Resp: Breathing unlabored on room air; no wheezing. Psych: Fluid speech in conversation; appropriate affect; normal thought process Neuro: Sensation intact throughout. No gross coordination deficits.   Ortho Exam - Left hand: There is mild thickening of the A1 pulley of the left small finger compared to the contralateral side.  There was 1 episode of mild triggering during passive and active finger mobility but no significant clicking or catching.  Redness or swelling.  - Right groin: Generalized muscular soreness medial to the ASIS and just proximal to the pubic symphysis.  There is no restriction with internal or external logroll.  There is some mild reproduction of pain with provocative maneuvers of the groin.  Negative Stinchfield and no pain with resisted hip flexion.  Imaging: No results found.  Past Medical/Family/Surgical/Social History: Medications & Allergies reviewed per EMR, new medications updated. Patient Active Problem List   Diagnosis Date Noted   Upper airway cough syndrome 03/05/2014   Allergic rhinitis 03/05/2014   GERD (gastroesophageal reflux disease) 03/05/2014   Past Medical History:  Diagnosis Date   Asthma    Environmental allergies    Headaches due to old head injury    Hypertension    Irregular heart rhythm    Family History  Problem Relation Age of Onset   Allergies Mother    Asthma Mother    Heart disease Maternal Grandfather    Heart disease Paternal Uncle    Heart disease Maternal Uncle    Heart disease Maternal Aunt    Cancer Maternal Aunt        brain   Cancer Maternal Grandmother        lung   Past Surgical History:  Procedure Laterality Date   WRIST ARTHROSCOPY  2003    Social History   Occupational History   Not on file  Tobacco Use   Smoking status: Never   Smokeless tobacco: Never  Substance and Sexual Activity   Alcohol use: Yes    Comment: occasional wine or beer   Drug use: No   Sexual activity: Not on file

## 2023-09-20 ENCOUNTER — Other Ambulatory Visit (INDEPENDENT_AMBULATORY_CARE_PROVIDER_SITE_OTHER): Payer: Self-pay

## 2023-09-20 ENCOUNTER — Encounter: Payer: Self-pay | Admitting: Sports Medicine

## 2023-09-20 ENCOUNTER — Ambulatory Visit (INDEPENDENT_AMBULATORY_CARE_PROVIDER_SITE_OTHER): Payer: BC Managed Care – PPO | Admitting: Sports Medicine

## 2023-09-20 DIAGNOSIS — M25521 Pain in right elbow: Secondary | ICD-10-CM

## 2023-09-20 DIAGNOSIS — G8929 Other chronic pain: Secondary | ICD-10-CM

## 2023-09-20 DIAGNOSIS — M25551 Pain in right hip: Secondary | ICD-10-CM

## 2023-09-20 DIAGNOSIS — M778 Other enthesopathies, not elsewhere classified: Secondary | ICD-10-CM | POA: Diagnosis not present

## 2023-09-20 DIAGNOSIS — S3981XD Other specified injuries of abdomen, subsequent encounter: Secondary | ICD-10-CM

## 2023-09-20 MED ORDER — METHYLPREDNISOLONE 4 MG PO TBPK: ORAL_TABLET | ORAL | 0 refills | Status: AC

## 2023-09-20 NOTE — Progress Notes (Addendum)
Ryan Clements - 57 y.o. male MRN [id number]  Date of birth: [birth year]  Office Visit Note: Visit Date: 09/20/2023 PCP: Ryan Penton, MD Referred by: Ryan Penton, MD  Subjective: Chief Complaint  Patient presents with   Right Hip - Follow-up, Pain   Right Elbow - Pain   HPI: Ryan Clements is a pleasant 57 y.o. male who presents today for evaluation of right hip/groin pain, right elbow pain.  Trigger finger left little finger -has had complete resolution of his triggering status post corticosteroid injections.  Right hip/groin pain -he has had this for about 3 months.  This started when he was doing leg press in the gym and he felt a sharp pain/pop in the inner groin.  This has improved and there are some days that it does not bother him, but with certain instances he will get a sharp pain/catch in the groin region.  Continues being active in the gym but has cut back from running.  Right elbow - pain in the extensor tendon wad.  No specific injury.  He does do gripping activities to strengthen the forearms but does is bilateral.  He is right-hand dominant.  He has pain from the common extensor origin down to the mid aspect of the forearm.  No numbness tingling or radicular pain.  Pertinent ROS were reviewed with the patient and found to be negative unless otherwise specified above in HPI.   Assessment & Plan: Visit Diagnoses:  1. Pain in right hip   2. Sports hernia, subsequent encounter   3. Chronic elbow pain, right   4. Tendinitis of right elbow    Plan: Discussed with [name] the nature of his right hip and groin pain which does seem indicative of a form of sports hernia with both hip flexors, adductor and insertional abdominal tendinopathy..  He has pain with flexion about the hip as well as external rotation and from the abdominal musculature near the origin of the right anterior hip and groin.  He has gotten better but his pain is not resolved, I think he needs to get into  formalized physical therapy for this specifically.  He also has some tendinitis about the right elbow at the common extensor wad more so near the supinator and common extensor tendons.  We will start him on a 6-day Medrol Dosepak to help with inflammatory change, he may discontinue his etodolac during this but may continue for 2 additional weeks following this.  We also will send him into formalized physical therapy to help treat this, may benefit from dry needling and other soft tissue techniques as well as structured rehab.  We will follow-up in 1 month for reevaluation.  Additional treatment considerations: Common extensor tendon injection under ultrasound, extracorporeal shockwave therapy  Follow-up: Return in about 1 month (around 10/20/2023) for for R-hip, R-elbow (30-min appt).   Meds & Orders: No orders of the defined types were placed in this encounter.   Orders Placed This Encounter  Procedures   XR HIP UNILAT W OR W/O PELVIS 2-3 VIEWS RIGHT     Procedures: No procedures performed      Clinical History: No specialty comments available.  He reports that he has never smoked. He has never used smokeless tobacco. No results for input(s): "HGBA1C", "LABURIC" in the last 8760 hours.  Objective:    Physical Exam  Gen: Well-appearing, in no acute distress; non-toxic CV: Well-perfused. Warm.  Resp: Breathing unlabored on room air; no wheezing. Psych: Fluid  speech in conversation; appropriate affect; normal thought process Neuro: Sensation intact throughout. No gross coordination deficits.   Ortho Exam - Right hip: No ASIS TTP or greater trochanter TTP.  There is no bony restriction with internal or external logroll.  There is restriction with hip flexion with tight hip flexor musculature.  There is pain with Stinchfield as well as resisted hip flexion both in a neutral and a 2 o'clock position.  Positive adductor squeeze and Carnett's test.  - Right elbow: + TTP over the origin of  the common extensor wad and knee.  Medial aspect of the lateral epicondyle.  There is pain with resisted supination and pronation.  Mild pain with Maudsley's test.  There is full range of motion about the elbow.  No elbow effusion.  Imaging: XR HIP UNILAT W OR W/O PELVIS 2-3 VIEWS RIGHT  Result Date: 09/20/2023 2 views of the right hip including AP and lateral femoral ordered and reviewed by myself.  X-rays demonstrate minimal arthritic change about the hip.  Femoral head is well located.  No acute fracture or otherwise bony abnormality noted.   Past Medical/Family/Surgical/Social History: Medications & Allergies reviewed per EMR, new medications updated. Patient Active Problem List   Diagnosis Date Noted   Upper airway cough syndrome 03/05/2014   Allergic rhinitis 03/05/2014   GERD (gastroesophageal reflux disease) 03/05/2014   Past Medical History:  Diagnosis Date   Asthma    Environmental allergies    Headaches due to old head injury    Hypertension    Irregular heart rhythm    Family History  Problem Relation Age of Onset   Allergies Mother    Asthma Mother    Heart disease Maternal Grandfather    Heart disease Paternal Uncle    Heart disease Maternal Uncle    Heart disease Maternal Aunt    Cancer Maternal Aunt        brain   Cancer Maternal Grandmother        lung   Past Surgical History:  Procedure Laterality Date   WRIST ARTHROSCOPY  2003   Social History   Occupational History   Not on file  Tobacco Use   Smoking status: Never   Smokeless tobacco: Never  Substance and Sexual Activity   Alcohol use: Yes    Comment: occasional wine or beer   Drug use: No   Sexual activity: Not on file

## 2023-09-20 NOTE — Addendum Note (Signed)
Addended by: Jamie Kato III on: 09/20/2023 03:06 PM   Modules accepted: Orders

## 2023-09-22 ENCOUNTER — Other Ambulatory Visit: Payer: Self-pay

## 2023-09-22 ENCOUNTER — Encounter: Payer: Self-pay | Admitting: Physical Therapy

## 2023-09-22 ENCOUNTER — Ambulatory Visit (INDEPENDENT_AMBULATORY_CARE_PROVIDER_SITE_OTHER): Payer: BC Managed Care – PPO | Admitting: Physical Therapy

## 2023-09-22 DIAGNOSIS — M6281 Muscle weakness (generalized): Secondary | ICD-10-CM

## 2023-09-22 DIAGNOSIS — R262 Difficulty in walking, not elsewhere classified: Secondary | ICD-10-CM | POA: Diagnosis not present

## 2023-09-22 DIAGNOSIS — M25551 Pain in right hip: Secondary | ICD-10-CM

## 2023-09-22 DIAGNOSIS — M25521 Pain in right elbow: Secondary | ICD-10-CM | POA: Diagnosis not present

## 2023-09-22 NOTE — Therapy (Signed)
OUTPATIENT PHYSICAL THERAPY EXTREMITY EVALUATION   Patient Name: Ryan Clements MRN: [id number] DOB:[birth year], 57 y.o., male Today's Date: 09/22/2023  END OF SESSION:  PT End of Session - 09/22/23 1208     Visit Number 1    Number of Visits 20    Date for PT Re-Evaluation 12/01/23    Progress Note Due on Visit 10    PT Start Time 0930    PT Stop Time 1015    PT Time Calculation (min) 45 min    Activity Tolerance Patient tolerated treatment well    Behavior During Therapy [facility] for tasks assessed/performed             Past Medical History:  Diagnosis Date   Asthma    Environmental allergies    Headaches due to old head injury    Hypertension    Irregular heart rhythm    Past Surgical History:  Procedure Laterality Date   WRIST ARTHROSCOPY  2003   Patient Active Problem List   Diagnosis Date Noted   Upper airway cough syndrome 03/05/2014   Allergic rhinitis 03/05/2014   GERD (gastroesophageal reflux disease) 03/05/2014    PCP: Danella Penton, MD   REFERRING PROVIDER: Madelyn Brunner, DO   REFERRING DIAG:  Diagnosis  M25.551 (ICD-10-CM) - Pain in right hip  S39.81XD (ICD-10-CM) - Sports hernia, subsequent encounter  M25.521,G89.29 (ICD-10-CM) - Chronic elbow pain, right  M77.8 (ICD-10-CM) - Tendinitis of right elbow    THERAPY DIAG:  Pain in right elbow  Pain in right hip  Difficulty in walking, not elsewhere classified  Muscle weakness (generalized)  Rationale for Evaluation and Treatment: Rehabilitation  ONSET DATE: hip: end of August  SUBJECTIVE:     SUBJECTIVE STATEMENT: Right hip/groin pain -he has had this for about 3 months.  This started when he was doing leg press in the gym and he felt a sharp pain/"pop" sensation in the right inner groin.  This has improved and there are some days that it does not bother him, but with certain instances he will get a sharp pain/catch in the groin region.  Continues being active in the gym but has cut  back from running.  Pt also reporting Rt elbow pain.   PERTINENT HISTORY: Trigger finger left little finger -has had complete resolution of his triggering status post corticosteroid injections.  PMH: asthma, HTN, irregular heart rhythm, wrist arthroscopy    PAIN:  NPRS scale: 4-6/10 Rt groin pain, 0-1/10 at rest in hip, 6/10 Rt elbow Pain description: pop in Rt hip/groin Aggravating factors: getting into car, turning door nobs Relieving factors: resting  PRECAUTIONS: None  WEIGHT BEARING RESTRICTIONS: No  FALLS:  Has patient fallen in last 6 months? No  LIVING ENVIRONMENT: Lives with: n/a Lives in: House/apartment Has following equipment at home: None    PLOF: Independent  PATIENT GOALS: "I would like to be able to workout at the gym and return to running with out pain"  Next MD visit: 10/18/23   OBJECTIVE:   DIAGNOSTIC FINDINGS:  09/20/23: 2 views of the right hip including AP and lateral femoral ordered and  reviewed by myself.  X-rays demonstrate minimal arthritic change about the  hip.  Femoral head is well located.  No acute fracture or otherwise bony  abnormality noted.  09/20/23:wrist:  1. Flexor pollicis longus tenosynovitis. 2. Soft tissue and intramuscular edema at the proximal thumb, partially imaged at the edge of the field of view. If concern for soft tissue injury of  the thumb, consider dedicated left thumb MRI. 3. Findings which raise the possibility for carpal tunnel syndrome. 4. Trace extensor compartment 2 tenosynovitis. 5. 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 6. Mild synovitis within the DRUJ.rist    PATIENT SURVEYS:  09/22/23: FOTO intake: 75      COGNITION: Overall cognitive status: WFL     TESTING:  09/22/23: Positive Thomas test on the Rt   LOWER EXTREMITY ROM:   ROM Right 08/2723 Left 08/2723  Hip flexion 90 100  Hip extension    Hip abduction    Hip adduction    Hip internal rotation    Hip external rotation     Knee flexion 125 125  Knee extension    Ankle dorsiflexion    Ankle plantarflexion    Ankle inversion    Ankle eversion     (Blank rows = not tested)  LOWER EXTREMITY MMT:  MMT Right 09/22/23 Left 08/2723  Hip flexion 4 5  Hip extension    Hip abduction 4 5  Hip adduction 4 5  Hip internal rotation 4 5  Hip external rotation 4 5  Knee flexion 5 5  Knee extension 5 5  Ankle dorsiflexion    Ankle plantarflexion    Ankle inversion    Ankle eversion     (Blank rows = not tested)  LOWER EXTREMITY SPECIAL TESTS:  Hip special tests: Maisie Fus test: positive  right    GAIT: Distance walked: 50 feet level surface Assistive device utilized: None Level of assistance: Complete Independence                                                                                                                                                                         TODAY'S TREATMENT                                                                          DATE: 09/22/23:  Therex: HEP instruction/performance c cues for techniques, handout provided.  Trial set performed of each for comprehension and symptom assessment.  See below for exercise list Manual:  Skilled palpation to active trigger points for TPDN Trigger Point Dry-Needling  Treatment instructions: Expect mild to moderate muscle soreness. S/S of pneumothorax if dry needled over a lung field, and to seek immediate medical attention should they occur. Patient verbalized understanding of these instructions and education.  Patient Consent Given: Yes Education handout provided: Yes Muscles treated: Rt wrist extensors Electrical stimulation performed:  No Parameters: N/A Treatment response/outcome: twitch response noted, pt with good tolerance to treatment    PATIENT EDUCATION:  Education details: HEP, POC Person educated: Patient Education method: Explanation, Demonstration, Verbal cues, and Handouts Education comprehension:  verbalized understanding, returned demonstration, and verbal cues required  HOME EXERCISE PROGRAM: Access Code: [email or web address] URL: [email or web address] Date: 09/22/2023 Prepared by: Narda Amber  Exercises - Standing Wrist Flexion Stretch  - 1-2 x daily - 7 x weekly - 3-5 reps - 20 seconds hold - Standing Wrist Extension Stretch  - 1-2 x daily - 7 x weekly - 3-5 reps - 20 seconds hold - Forearm Pronation and Supination with Hammer  - 1-2 x daily - 7 x weekly - 2 sets - 10 reps - Wrist Extension with Resistance  - 1-2 x daily - 7 x weekly - 2 sets - 10 reps - Hip Flexor Stretch with Chair  - 1-2 x daily - 7 x weekly - 3-5 reps - 20 seconds hold - Half Kneeling Hip Flexor Stretch with Sidebend  - 1-2 x daily - 7 x weekly - 3-5 reps - 20 seconds hold - Supine Figure 4 Piriformis Stretch  - 1-2 x daily - 7 x weekly - 3-5 reps - 20 seconds hold  ASSESSMENT:  CLINICAL IMPRESSION: Patient is a 57 y.o. who comes to clinic with complaints of Rt hip pain and Rt elbow pain with mobility, strength and movement coordination deficits that impair their ability to perform usual daily and recreational functional activities without increase difficulty/symptoms at this time.  Patient to benefit from skilled PT services to address impairments and limitations to improve to previous level of function without restriction secondary to condition.   OBJECTIVE IMPAIRMENTS: decreased activity tolerance, difficulty walking, decreased ROM, decreased strength, increased edema, impaired flexibility, impaired UE functional use, and pain.   ACTIVITY LIMITATIONS: lifting, sitting, squatting, and recreational activities  PARTICIPATION LIMITATIONS: driving, community activity, and recreational activities  PERSONAL FACTORS:  see pertinent history above  are also affecting patient's functional outcome.   REHAB POTENTIAL: Good  CLINICAL DECISION MAKING: Stable/uncomplicated  EVALUATION COMPLEXITY:  Moderate   GOALS: Goals reviewed with patient? Yes  SHORT TERM GOALS: (target date for Short term goals are 3 weeks 10/13/2023)   1.  Patient will demonstrate independent use of home exercise program to maintain progress from in clinic treatments.  Goal status: New  LONG TERM GOALS: (target dates for all long term goals are 10 weeks  12/01/2023 )   1. Patient will demonstrate/report pain at worst less than or equal to 2/10 to facilitate minimal limitation in daily activity secondary to pain symptoms.  Goal status: New   2. Patient will demonstrate independent use of home exercise program to facilitate ability to maintain/progress functional gains from skilled physical therapy services.  Goal status: New   3. Patient will demonstrate FOTO outcome > or = 82 % to indicate reduced disability due to condition.  Goal status: New   4.  Patient will demonstrate Rt  LE MMT 5/5 throughout to faciltiate usual transfers, stairs, squatting at [facility] for daily life.   Goal status: New   5.  Patient will demonstrate ability to perform pronation/supination of Rt UE with 5# weight with no pain reported.  Goal status: New   6.  Pt will be able to transfer in and out of his vehicle with no pain noted in Rt hip/groin.  Goal status: New   7.  Pt will be able begin running short distances (1/4  mile) with pain </= 2/10 in his Rt groin/hip.  Goal Status: New   PLAN:  PT FREQUENCY: 1-2x/week  PT DURATION: 10 weeks  PLANNED INTERVENTIONS: Can include 44010- PT Re-evaluation, 97110-Therapeutic exercises, 97530- Therapeutic activity, [id number]- Neuromuscular re-education, 97535- Self Care, 97140- Manual therapy, [phone]- Gait training, [phone]- Orthotic Fit/training, [phone]- Canalith repositioning, [id number]- Aquatic Therapy, 97014- Electrical stimulation (unattended), [id number]- Electrical stimulation (manual), [id number]- Vasopneumatic device, [id number]- Ultrasound, [id number]- Traction (mechanical), [id number]- Ionotophoresis 4mg /ml  Dexamethasone, Patient/Family education, Balance training, Stair training, Taping, Dry Needling, Joint mobilization, Joint manipulation, Spinal manipulation, Spinal mobilization, Scar mobilization, Vestibular training, Visual/preceptual remediation/compensation, DME instructions, Cryotherapy, and Moist heat.  All performed as medically necessary.  All included unless contraindicated  PLAN FOR NEXT SESSION: Review HEP knowledge/results, hip flexor stretches, hip strengthening with abd/add and rotation, IR/ER hip stretches  See how DN went to Rt wrist extensors?   Next MD visit: 10/18/23     Sharmon Leyden, PT, MPT 09/22/2023, 12:27 PM

## 2023-09-29 ENCOUNTER — Encounter: Payer: Self-pay | Admitting: Physical Therapy

## 2023-09-29 ENCOUNTER — Ambulatory Visit: Payer: BC Managed Care – PPO | Admitting: Physical Therapy

## 2023-09-29 DIAGNOSIS — M25521 Pain in right elbow: Secondary | ICD-10-CM

## 2023-09-29 DIAGNOSIS — M25551 Pain in right hip: Secondary | ICD-10-CM

## 2023-09-29 DIAGNOSIS — M6281 Muscle weakness (generalized): Secondary | ICD-10-CM

## 2023-09-29 DIAGNOSIS — R262 Difficulty in walking, not elsewhere classified: Secondary | ICD-10-CM

## 2023-09-29 NOTE — Therapy (Signed)
OUTPATIENT PHYSICAL THERAPY EXTREMITY    Patient Name: Ryan Clements MRN: [id number] DOB:[birth year], 57 y.o., male Today's Date: 09/29/2023  END OF SESSION:  PT End of Session - 09/29/23 1201     Visit Number 2    Number of Visits 20    Date for PT Re-Evaluation 12/01/23    Progress Note Due on Visit 10    PT Start Time 0800    PT Stop Time 0843    PT Time Calculation (min) 43 min    Activity Tolerance Patient tolerated treatment well    Behavior During Therapy [facility] for tasks assessed/performed              Past Medical History:  Diagnosis Date   Asthma    Environmental allergies    Headaches due to old head injury    Hypertension    Irregular heart rhythm    Past Surgical History:  Procedure Laterality Date   WRIST ARTHROSCOPY  2003   Patient Active Problem List   Diagnosis Date Noted   Upper airway cough syndrome 03/05/2014   Allergic rhinitis 03/05/2014   GERD (gastroesophageal reflux disease) 03/05/2014    PCP: Danella Penton, MD   REFERRING PROVIDER: Madelyn Brunner, DO   REFERRING DIAG:  Diagnosis  M25.551 (ICD-10-CM) - Pain in right hip  S39.81XD (ICD-10-CM) - Sports hernia, subsequent encounter  M25.521,G89.29 (ICD-10-CM) - Chronic elbow pain, right  M77.8 (ICD-10-CM) - Tendinitis of right elbow    THERAPY DIAG:  Pain in right elbow  Pain in right hip  Difficulty in walking, not elsewhere classified  Muscle weakness (generalized)  Rationale for Evaluation and Treatment: Rehabilitation  ONSET DATE: hip: end of August  SUBJECTIVE:     SUBJECTIVE STATEMENT: Pt arriving reporting 4-/5/10 pain in his Rt groin. Pt still reporting "popping" sensation at times. Pt reporting over the holiday he was not very consistent with his HEP 2x/ week.   PERTINENT HISTORY: Trigger finger left little finger -has had complete resolution of his triggering status post corticosteroid injections.  PMH: asthma, HTN, irregular heart rhythm, wrist  arthroscopy   PAIN:  NPRS scale: no pain at rest in his Rt forarm worse pain with pain with pronation/supination, Rt groin pain of 4-5/10.  Pain description: pop in Rt hip/groin, achy soreness in Rt forarm Aggravating factors: getting into car, turning door nobs Relieving factors: resting   PRECAUTIONS: None  WEIGHT BEARING RESTRICTIONS: No  FALLS:  Has patient fallen in last 6 months? No  LIVING ENVIRONMENT: Lives with: n/a Lives in: House/apartment Has following equipment at home: None    PLOF: Independent  PATIENT GOALS: "I would like to be able to workout at the gym and return to running with out pain"  Next MD visit: 10/18/23   OBJECTIVE:   DIAGNOSTIC FINDINGS:  09/20/23: 2 views of the right hip including AP and lateral femoral ordered and  reviewed by myself.  X-rays demonstrate minimal arthritic change about the  hip.  Femoral head is well located.  No acute fracture or otherwise bony  abnormality noted.  09/20/23:wrist:  1. Flexor pollicis longus tenosynovitis. 2. Soft tissue and intramuscular edema at the proximal thumb, partially imaged at the edge of the field of view. If concern for soft tissue injury of the thumb, consider dedicated left thumb MRI. 3. Findings which raise the possibility for carpal tunnel syndrome. 4. Trace extensor compartment 2 tenosynovitis. 5. 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 6. Mild synovitis within the DRUJ.rist    PATIENT  SURVEYS:  09/22/23: FOTO intake: 75      COGNITION: Overall cognitive status: WFL     TESTING:  09/22/23: Positive Thomas test on the Rt   LOWER EXTREMITY ROM:   ROM Right 08/2723 Left 08/2723  Hip flexion 90 100  Hip extension    Hip abduction    Hip adduction    Hip internal rotation    Hip external rotation    Knee flexion 125 125  Knee extension    Ankle dorsiflexion    Ankle plantarflexion    Ankle inversion    Ankle eversion     (Blank rows = not tested)  LOWER  EXTREMITY MMT:  MMT Right 09/22/23 Left 08/2723  Hip flexion 4 5  Hip extension    Hip abduction 4 5  Hip adduction 4 5  Hip internal rotation 4 5  Hip external rotation 4 5  Knee flexion 5 5  Knee extension 5 5  Ankle dorsiflexion    Ankle plantarflexion    Ankle inversion    Ankle eversion     (Blank rows = not tested)  LOWER EXTREMITY SPECIAL TESTS:  Hip special tests: Maisie Fus test: positive  right    GAIT: Distance walked: 50 feet level surface Assistive device utilized: None Level of assistance: Complete Independence                                                                                                                                                                        TODAY'S TREATMENT                                                                          DATE: 09/29/23:  Therex: Nustep: Level 6 x 5 minutes UE/LE Sidleying hip abduction  2 x 10  Sidelying hip abduction/extension circles both direction x 10 (pt with increased difficulty and required rest breaks due to muscle cramping) Sidelying reverse clams: 2 x 10  Manual:  Skilled palpation to active trigger points for TPDN Trigger Point Dry-Needling  Treatment instructions: Expect mild to moderate muscle soreness. S/S of pneumothorax if dry needled over a lung field, and to seek immediate medical attention should they occur. Patient verbalized understanding of these instructions and education.  Patient Consent Given: Yes Education handout provided: Yes Muscles treated: Rt wrist extensors, Rt proximal rectus femoris. Electrical stimulation performed: No Parameters: N/A Treatment response/outcome: twitch response noted, pt with good tolerance to treatment     TODAY'S  TREATMENT                                                                          DATE: 09/22/23:  Therex: HEP instruction/performance c cues for techniques, handout provided.  Trial set performed of each for comprehension and  symptom assessment.  See below for exercise list Manual:  Skilled palpation to active trigger points for TPDN Trigger Point Dry-Needling  Treatment instructions: Expect mild to moderate muscle soreness. S/S of pneumothorax if dry needled over a lung field, and to seek immediate medical attention should they occur. Patient verbalized understanding of these instructions and education.  Patient Consent Given: Yes Education handout provided: Yes Muscles treated: Rt wrist extensors Electrical stimulation performed: No Parameters: N/A Treatment response/outcome: twitch response noted, pt with good tolerance to treatment    PATIENT EDUCATION:  Education details: HEP, POC Person educated: Patient Education method: Programmer, multimedia, Demonstration, Verbal cues, and Handouts Education comprehension: verbalized understanding, returned demonstration, and verbal cues required  HOME EXERCISE PROGRAM: Access Code: [email or web address] URL: [email or web address] Date: 09/22/2023 Prepared by: Narda Amber  Exercises - Standing Wrist Flexion Stretch  - 1-2 x daily - 7 x weekly - 3-5 reps - 20 seconds hold - Standing Wrist Extension Stretch  - 1-2 x daily - 7 x weekly - 3-5 reps - 20 seconds hold - Forearm Pronation and Supination with Hammer  - 1-2 x daily - 7 x weekly - 2 sets - 10 reps - Wrist Extension with Resistance  - 1-2 x daily - 7 x weekly - 2 sets - 10 reps - Hip Flexor Stretch with Chair  - 1-2 x daily - 7 x weekly - 3-5 reps - 20 seconds hold - Half Kneeling Hip Flexor Stretch with Sidebend  - 1-2 x daily - 7 x weekly - 3-5 reps - 20 seconds hold - Supine Figure 4 Piriformis Stretch  - 1-2 x daily - 7 x weekly - 3-5 reps - 20 seconds hold  ASSESSMENT:  CLINICAL IMPRESSION: Pt arriving today with 4-5/10 pain in his Rt groin. Pt with muscle fatigue during exercises today and difficulty noted with hip extension and abduction of his Rt LE. Pt tolerating DN to pt's Rt proximal quads and Rt  UE extensor muscles. Recommend continued skilled PT.      OBJECTIVE IMPAIRMENTS: decreased activity tolerance, difficulty walking, decreased ROM, decreased strength, increased edema, impaired flexibility, impaired UE functional use, and pain.   ACTIVITY LIMITATIONS: lifting, sitting, squatting, and recreational activities  PARTICIPATION LIMITATIONS: driving, community activity, and recreational activities  PERSONAL FACTORS:  see pertinent history above  are also affecting patient's functional outcome.   REHAB POTENTIAL: Good  CLINICAL DECISION MAKING: Stable/uncomplicated  EVALUATION COMPLEXITY: Moderate   GOALS: Goals reviewed with patient? Yes  SHORT TERM GOALS: (target date for Short term goals are 3 weeks 10/13/2023)   1.  Patient will demonstrate independent use of home exercise program to maintain progress from in clinic treatments.  Goal status: New  LONG TERM GOALS: (target dates for all long term goals are 10 weeks  12/01/2023 )   1. Patient will demonstrate/report pain at worst less than or equal to 2/10 to facilitate minimal limitation in daily activity secondary to pain symptoms.  Goal status: New   2. Patient will demonstrate independent use of home exercise program to facilitate ability to maintain/progress functional gains from skilled physical therapy services.  Goal status: New   3. Patient will demonstrate FOTO outcome > or = 82 % to indicate reduced disability due to condition.  Goal status: New   4.  Patient will demonstrate Rt  LE MMT 5/5 throughout to faciltiate usual transfers, stairs, squatting at [facility] for daily life.   Goal status: New   5.  Patient will demonstrate ability to perform pronation/supination of Rt UE with 5# weight with no pain reported.  Goal status: New   6.  Pt will be able to transfer in and out of his vehicle with no pain noted in Rt hip/groin.  Goal status: New   7.  Pt will be able begin running short distances (1/4 mile)  with pain </= 2/10 in his Rt groin/hip.  Goal Status: New   PLAN:  PT FREQUENCY: 1-2x/week  PT DURATION: 10 weeks  PLANNED INTERVENTIONS: Can include 40981- PT Re-evaluation, 97110-Therapeutic exercises, 97530- Therapeutic activity, [id number]- Neuromuscular re-education, 97535- Self Care, 97140- Manual therapy, [phone]- Gait training, [phone]- Orthotic Fit/training, [phone]- Canalith repositioning, [id number]- Aquatic Therapy, 97014- Electrical stimulation (unattended), [id number]- Electrical stimulation (manual), [id number]- Vasopneumatic device, [id number]- Ultrasound, [id number]- Traction (mechanical), [id number]- Ionotophoresis 4mg /ml Dexamethasone, Patient/Family education, Balance training, Stair training, Taping, Dry Needling, Joint mobilization, Joint manipulation, Spinal manipulation, Spinal mobilization, Scar mobilization, Vestibular training, Visual/preceptual remediation/compensation, DME instructions, Cryotherapy, and Moist heat.  All performed as medically necessary.  All included unless contraindicated  PLAN FOR NEXT SESSION: Review HEP knowledge/results, hip flexor stretches, hip strengthening with abd/add and rotation, IR/ER hip stretches  How did DN go on quads?  DN TFL next visit is appropriate.   Next MD visit: 10/18/23     Sharmon Leyden, PT, MPT 09/29/2023, 12:35 PM

## 2023-10-06 ENCOUNTER — Ambulatory Visit (INDEPENDENT_AMBULATORY_CARE_PROVIDER_SITE_OTHER): Payer: BC Managed Care – PPO | Admitting: Rehabilitative and Restorative Service Providers"

## 2023-10-06 ENCOUNTER — Encounter: Payer: Self-pay | Admitting: Rehabilitative and Restorative Service Providers"

## 2023-10-06 DIAGNOSIS — R262 Difficulty in walking, not elsewhere classified: Secondary | ICD-10-CM

## 2023-10-06 DIAGNOSIS — M25551 Pain in right hip: Secondary | ICD-10-CM

## 2023-10-06 DIAGNOSIS — M25521 Pain in right elbow: Secondary | ICD-10-CM

## 2023-10-06 DIAGNOSIS — M6281 Muscle weakness (generalized): Secondary | ICD-10-CM | POA: Diagnosis not present

## 2023-10-06 NOTE — Therapy (Signed)
OUTPATIENT PHYSICAL THERAPY TREATMENT    Patient Name: Ryan Clements MRN: [id number] DOB:Jan 29, 1966, 57 y.o., male Today's Date: 10/06/2023  END OF SESSION:  PT End of Session - 10/06/23 0848     Visit Number 3    Number of Visits 20    Date for PT Re-Evaluation 12/01/23    Progress Note Due on Visit 10    PT Start Time 0845    PT Stop Time 0925    PT Time Calculation (min) 40 min    Activity Tolerance Patient tolerated treatment well    Behavior During Therapy [facility] for tasks assessed/performed               Past Medical History:  Diagnosis Date   Asthma    Environmental allergies    Headaches due to old head injury    Hypertension    Irregular heart rhythm    Past Surgical History:  Procedure Laterality Date   WRIST ARTHROSCOPY  2003   Patient Active Problem List   Diagnosis Date Noted   Upper airway cough syndrome 03/05/2014   Allergic rhinitis 03/05/2014   GERD (gastroesophageal reflux disease) 03/05/2014    PCP: Danella Penton, MD   REFERRING PROVIDER: Madelyn Brunner, DO   REFERRING DIAG:  Diagnosis  M25.551 (ICD-10-CM) - Pain in right hip  S39.81XD (ICD-10-CM) - Sports hernia, subsequent encounter  M25.521,G89.29 (ICD-10-CM) - Chronic elbow pain, right  M77.8 (ICD-10-CM) - Tendinitis of right elbow    THERAPY DIAG:  Pain in right elbow  Pain in right hip  Difficulty in walking, not elsewhere classified  Muscle weakness (generalized)  Rationale for Evaluation and Treatment: Rehabilitation  ONSET DATE: hip: end of August  SUBJECTIVE:  SUBJECTIVE STATEMENT: Pt indicated he would like to try needling today.  Pt wanted to help assess whether to keep coming or not.  Felt like needling helped arm.  Quad helps in short term but still there.   PERTINENT HISTORY: Trigger finger left little finger -has had complete resolution of his triggering status post corticosteroid injections.  PMH: asthma, HTN, irregular heart rhythm, wrist  arthroscopy   PAIN:  NPRS scale:  Pain description: pop in Rt hip/groin, achy soreness in Rt forarm Aggravating factors: getting into car, turning door nobs Relieving factors: resting   PRECAUTIONS: None  WEIGHT BEARING RESTRICTIONS: No  FALLS:  Has patient fallen in last 6 months? No  LIVING ENVIRONMENT: Lives with: n/a Lives in: House/apartment Has following equipment at home: None  PLOF: Independent  PATIENT GOALS: "I would like to be able to workout at the gym and return to running with out pain"  Next MD visit: 10/18/23  OBJECTIVE:   DIAGNOSTIC FINDINGS:  09/22/2023 review:    09/20/23: 2 views of the right hip including AP and lateral femoral ordered and  reviewed by myself.  X-rays demonstrate minimal arthritic change about the  hip.  Femoral head is well located.  No acute fracture or otherwise bony  abnormality noted.  09/20/23:wrist:  1. Flexor pollicis longus tenosynovitis. 2. Soft tissue and intramuscular edema at the proximal thumb, partially imaged at the edge of the field of view. If concern for soft tissue injury of the thumb, consider dedicated left thumb MRI. 3. Findings which raise the possibility for carpal tunnel syndrome. 4. Trace extensor compartment 2 tenosynovitis. 5. 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 6. Mild synovitis within the DRUJ.rist    PATIENT SURVEYS:  09/22/23: FOTO intake: 75      COGNITION: 09/22/2023 Overall  cognitive status: [facility]   PALPATION 10/06/2023: Trigger point tenderness c concordant symptoms noted in Rt elbow extensor group, Rt hip adductors, proximal rectus femoris, pectineus.    TESTING:  09/22/23: Positive Thomas test on the Rt   LOWER EXTREMITY ROM:   ROM Right 08/2723 Left 08/2723  Hip flexion 90 100  Hip extension    Hip abduction    Hip adduction    Hip internal rotation    Hip external rotation    Knee flexion 125 125  Knee extension    Ankle dorsiflexion    Ankle plantarflexion     Ankle inversion    Ankle eversion     (Blank rows = not tested)  LOWER EXTREMITY MMT:  MMT Right 09/22/23 Left 08/2723  Hip flexion 4 5  Hip extension    Hip abduction 4 5  Hip adduction 4 5  Hip internal rotation 4 5  Hip external rotation 4 5  Knee flexion 5 5  Knee extension 5 5  Ankle dorsiflexion    Ankle plantarflexion    Ankle inversion    Ankle eversion     (Blank rows = not tested)  LOWER EXTREMITY SPECIAL TESTS:  09/22/2023 Hip special tests: Maisie Fus test: positive  right  GAIT: 09/22/2023 Distance walked: 50 feet level surface Assistive device utilized: None Level of assistance: Complete Independence                                                                                                                                                                         TODAY'S TREATMENT                                                                          DATE: 10/06/2023: Therex: Time spent in education with verbal cues and visual demonstration of select exercises.  Gave cues for progression of wrist extensor to eccentric loading with increased resistance with recommendation of 2x/per day 3 x 10.  Also spent time in activity modification education  with exercises and general daily activity with advice to avoid activity that increased pain > 5/10 to avoid exacerbations. Question and answer time spent in review.    Manual: Compression to Rt wrist extensors, Rt hip adductor group.  Percussive device to Rt adductor group.   Trigger Point Dry-Needling  Treatment instructions: Expect mild to moderate muscle soreness. S/S of pneumothorax if dry needled over a lung field, and to seek immediate medical attention should they  occur. Patient verbalized understanding of these instructions and education.  Patient Consent Given: Yes Education handout provided: Previously provided Muscles treated: Rt wrist extensors, Rt proximal hip adductors Treatment response/outcome:  twitch response c concordant symptoms, fair tolerance overall.     TODAY'S TREATMENT                                                                          DATE:09/29/23:  Therex: Nustep: Level 6 x 5 minutes UE/LE Sidleying hip abduction  2 x 10  Sidelying hip abduction/extension circles both direction x 10 (pt with increased difficulty and required rest breaks due to muscle cramping) Sidelying reverse clams: 2 x 10  Manual:  Skilled palpation to active trigger points for TPDN Trigger Point Dry-Needling  Treatment instructions: Expect mild to moderate muscle soreness. S/S of pneumothorax if dry needled over a lung field, and to seek immediate medical attention should they occur. Patient verbalized understanding of these instructions and education.  Patient Consent Given: Yes Education handout provided: Yes Muscles treated: Rt wrist extensors, Rt proximal rectus femoris. Electrical stimulation performed: No Parameters: N/A Treatment response/outcome: twitch response noted, pt with good tolerance to treatment     TODAY'S TREATMENT                                                                          DATE: 09/22/23:  Therex: HEP instruction/performance c cues for techniques, handout provided.  Trial set performed of each for comprehension and symptom assessment.  See below for exercise list Manual:  Skilled palpation to active trigger points for TPDN Trigger Point Dry-Needling  Treatment instructions: Expect mild to moderate muscle soreness. S/S of pneumothorax if dry needled over a lung field, and to seek immediate medical attention should they occur. Patient verbalized understanding of these instructions and education.  Patient Consent Given: Yes Education handout provided: Yes Muscles treated: Rt wrist extensors Electrical stimulation performed: No Parameters: N/A Treatment response/outcome: twitch response noted, pt with good tolerance to treatment    PATIENT EDUCATION:   Education details: HEP, POC Person educated: Patient Education method: Programmer, multimedia, Demonstration, Verbal cues, and Handouts Education comprehension: verbalized understanding, returned demonstration, and verbal cues required  HOME EXERCISE PROGRAM: Access Code: [email or web address] URL: [email or web address] Date: 09/22/2023 Prepared by: Narda Amber  Exercises - Standing Wrist Flexion Stretch  - 1-2 x daily - 7 x weekly - 3-5 reps - 20 seconds hold - Standing Wrist Extension Stretch  - 1-2 x daily - 7 x weekly - 3-5 reps - 20 seconds hold - Forearm Pronation and Supination with Hammer  - 1-2 x daily - 7 x weekly - 2 sets - 10 reps - Wrist Extension with Resistance  - 1-2 x daily - 7 x weekly - 2 sets - 10 reps - Hip Flexor Stretch with Chair  - 1-2 x daily - 7 x weekly - 3-5 reps - 20 seconds hold - Half Kneeling Hip Flexor Stretch with Sidebend  -  1-2 x daily - 7 x weekly - 3-5 reps - 20 seconds hold - Supine Figure 4 Piriformis Stretch  - 1-2 x daily - 7 x weekly - 3-5 reps - 20 seconds hold  ASSESSMENT:  CLINICAL IMPRESSION: Presentation today showed continued trigger points with concordant symptoms in Rt lateral elbow and anterior/medial proximal thigh.  Short term improvements have been noted from previous dry needling treatments.  Included adductor group of Rt thigh today.  Discussed with patient continued treatment options for efforts to lower pain as well as progression of some HEP to increase loading/strengthening such as wrist extension eccentric focus with blue band for more resistance.    OBJECTIVE IMPAIRMENTS: decreased activity tolerance, difficulty walking, decreased ROM, decreased strength, increased edema, impaired flexibility, impaired UE functional use, and pain.   ACTIVITY LIMITATIONS: lifting, sitting, squatting, and recreational activities  PARTICIPATION LIMITATIONS: driving, community activity, and recreational activities  PERSONAL FACTORS:  see  pertinent history above  are also affecting patient's functional outcome.   REHAB POTENTIAL: Good  CLINICAL DECISION MAKING: Stable/uncomplicated  EVALUATION COMPLEXITY: Moderate   GOALS: Goals reviewed with patient? Yes  SHORT TERM GOALS: (target date for Short term goals are 3 weeks 10/13/2023)   1.  Patient will demonstrate independent use of home exercise program to maintain progress from in clinic treatments.  Goal status: on going 10/06/2023  LONG TERM GOALS: (target dates for all long term goals are 10 weeks  12/01/2023 )   1. Patient will demonstrate/report pain at worst less than or equal to 2/10 to facilitate minimal limitation in daily activity secondary to pain symptoms.  Goal status: New   2. Patient will demonstrate independent use of home exercise program to facilitate ability to maintain/progress functional gains from skilled physical therapy services.  Goal status: New   3. Patient will demonstrate FOTO outcome > or = 82 % to indicate reduced disability due to condition.  Goal status: New   4.  Patient will demonstrate Rt  LE MMT 5/5 throughout to faciltiate usual transfers, stairs, squatting at [facility] for daily life.   Goal status: New   5.  Patient will demonstrate ability to perform pronation/supination of Rt UE with 5# weight with no pain reported.  Goal status: New   6.  Pt will be able to transfer in and out of his vehicle with no pain noted in Rt hip/groin.  Goal status: New   7.  Pt will be able begin running short distances (1/4 mile) with pain </= 2/10 in his Rt groin/hip.  Goal Status: New   PLAN:  PT FREQUENCY: 1-2x/week  PT DURATION: 10 weeks  PLANNED INTERVENTIONS: Can include 09811- PT Re-evaluation, 97110-Therapeutic exercises, 97530- Therapeutic activity, [id number]- Neuromuscular re-education, 97535- Self Care, [id number]- Manual therapy, [phone]- Gait training, [phone]- Orthotic Fit/training, [phone]- Canalith repositioning, [id number]- Aquatic Therapy,  97014- Electrical stimulation (unattended), [id number]- Electrical stimulation (manual), [id number]- Vasopneumatic device, [id number]- Ultrasound, [id number]- Traction (mechanical), [id number]- Ionotophoresis 4mg /ml Dexamethasone, Patient/Family education, Balance training, Stair training, Taping, Dry Needling, Joint mobilization, Joint manipulation, Spinal manipulation, Spinal mobilization, Scar mobilization, Vestibular training, Visual/preceptual remediation/compensation, DME instructions, Cryotherapy, and Moist heat.  All performed as medically necessary.  All included unless contraindicated  PLAN FOR NEXT SESSION: Check of needling response, specifically hip adductors.  Review eccentric focus for wrist extension with blue band progression.   Adductor stretch and strengthening may be beneficial.   Next MD visit: 10/18/23  Chyrel Masson, PT, DPT, OCS, ATC 10/06/23  10:17 AM

## 2023-10-13 ENCOUNTER — Encounter: Payer: Self-pay | Admitting: Physical Therapy

## 2023-10-13 ENCOUNTER — Ambulatory Visit: Payer: BC Managed Care – PPO | Admitting: Physical Therapy

## 2023-10-13 DIAGNOSIS — M25521 Pain in right elbow: Secondary | ICD-10-CM | POA: Diagnosis not present

## 2023-10-13 DIAGNOSIS — R262 Difficulty in walking, not elsewhere classified: Secondary | ICD-10-CM

## 2023-10-13 DIAGNOSIS — M6281 Muscle weakness (generalized): Secondary | ICD-10-CM | POA: Diagnosis not present

## 2023-10-13 DIAGNOSIS — M25551 Pain in right hip: Secondary | ICD-10-CM

## 2023-10-13 NOTE — Therapy (Signed)
OUTPATIENT PHYSICAL THERAPY TREATMENT    Patient Name: Ryan Clements MRN: [id number] DOB:[birth year], 57 y.o., male Today's Date: 10/13/2023  END OF SESSION:  PT End of Session - 10/13/23 1153     Visit Number 4    Number of Visits 20    Date for PT Re-Evaluation 12/01/23    Progress Note Due on Visit 10    PT Start Time 0845    PT Stop Time 0930    PT Time Calculation (min) 45 min    Activity Tolerance Patient tolerated treatment well    Behavior During Therapy [facility] for tasks assessed/performed               Past Medical History:  Diagnosis Date   Asthma    Environmental allergies    Headaches due to old head injury    Hypertension    Irregular heart rhythm    Past Surgical History:  Procedure Laterality Date   WRIST ARTHROSCOPY  2003   Patient Active Problem List   Diagnosis Date Noted   Upper airway cough syndrome 03/05/2014   Allergic rhinitis 03/05/2014   GERD (gastroesophageal reflux disease) 03/05/2014    PCP: Danella Penton, MD   REFERRING PROVIDER: Madelyn Brunner, DO   REFERRING DIAG:  Diagnosis  M25.551 (ICD-10-CM) - Pain in right hip  S39.81XD (ICD-10-CM) - Sports hernia, subsequent encounter  M25.521,G89.29 (ICD-10-CM) - Chronic elbow pain, right  M77.8 (ICD-10-CM) - Tendinitis of right elbow    THERAPY DIAG:  Pain in right elbow  Pain in right hip  Difficulty in walking, not elsewhere classified  Muscle weakness (generalized)  Rationale for Evaluation and Treatment: Rehabilitation  ONSET DATE: hip: end of August  SUBJECTIVE:  SUBJECTIVE STATEMENT: Pt indicated he would like to try needling today.  Pt wanted to help assess whether to keep coming or not.  Felt like needling helped arm.  Quad helps in short term but still there.   PERTINENT HISTORY: Trigger finger left little finger -has had complete resolution of his triggering status post corticosteroid injections.  PMH: asthma, HTN, irregular heart rhythm, wrist  arthroscopy   PAIN:  NPRS scale:  Pain description: pop in Rt hip/groin, achy soreness in Rt forarm Aggravating factors: getting into car, turning door nobs Relieving factors: resting   PRECAUTIONS: None  WEIGHT BEARING RESTRICTIONS: No  FALLS:  Has patient fallen in last 6 months? No  LIVING ENVIRONMENT: Lives with: n/a Lives in: House/apartment Has following equipment at home: None  PLOF: Independent  PATIENT GOALS: "I would like to be able to workout at the gym and return to running with out pain"  Next MD visit: 10/18/23  OBJECTIVE:   DIAGNOSTIC FINDINGS:  09/22/2023 review:    09/20/23: 2 views of the right hip including AP and lateral femoral ordered and  reviewed by myself.  X-rays demonstrate minimal arthritic change about the  hip.  Femoral head is well located.  No acute fracture or otherwise bony  abnormality noted.  09/20/23:wrist:  1. Flexor pollicis longus tenosynovitis. 2. Soft tissue and intramuscular edema at the proximal thumb, partially imaged at the edge of the field of view. If concern for soft tissue injury of the thumb, consider dedicated left thumb MRI. 3. Findings which raise the possibility for carpal tunnel syndrome. 4. Trace extensor compartment 2 tenosynovitis. 5. 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 6. Mild synovitis within the DRUJ.rist    PATIENT SURVEYS:  09/22/23: FOTO intake: 75      COGNITION: 09/22/2023 Overall  cognitive status: [facility]   PALPATION 10/06/2023: Trigger point tenderness c concordant symptoms noted in Rt elbow extensor group, Rt hip adductors, proximal rectus femoris, pectineus.    TESTING:  09/22/23: Positive Thomas test on the Rt   LOWER EXTREMITY ROM:   ROM Right 08/2723 Left 08/2723  Hip flexion 90 100  Hip extension    Hip abduction    Hip adduction    Hip internal rotation    Hip external rotation    Knee flexion 125 125  Knee extension    Ankle dorsiflexion    Ankle plantarflexion     Ankle inversion    Ankle eversion     (Blank rows = not tested)  LOWER EXTREMITY MMT:  MMT Right 09/22/23 Left 08/2723  Hip flexion 4 5  Hip extension    Hip abduction 4 5  Hip adduction 4 5  Hip internal rotation 4 5  Hip external rotation 4 5  Knee flexion 5 5  Knee extension 5 5  Ankle dorsiflexion    Ankle plantarflexion    Ankle inversion    Ankle eversion     (Blank rows = not tested)  LOWER EXTREMITY SPECIAL TESTS:  09/22/2023 Hip special tests: Maisie Fus test: positive  right  GAIT: 09/22/2023 Distance walked: 50 feet level surface Assistive device utilized: None Level of assistance: Complete Independence                                                                                                                                                                         TODAY'S TREATMENT                                                                          DATE:10/13/23:  Therex: Standing: hip rotation in 90 deg hip flexion x 10 bil LE c UE support as needed Standing hip flexor stretch x 3 holding 30 sec "Y" reaching x 10 using bil LE's Standing side lunges x 5 bil  Adductor stretching in side lunge position x 1 holding 30 sec Manual:  Skilled palpation to active trigger points for TPDN Trigger Point Dry-Needling  Treatment instructions: Expect mild to moderate muscle soreness. S/S of pneumothorax if dry needled over a lung field, and to seek immediate medical attention should they occur. Patient verbalized understanding of these instructions and education.  Patient Consent Given: Yes Education handout provided: Yes Muscles treated: Rt wrist extensors, Rt iliopsoas Electrical stimulation performed: No Parameters: N/A  Treatment response/outcome: twitch response noted, pt with good tolerance to treatment of Rt UE, pt with increased in tension and reporting more pain with TPDN to iliopsoas Modalites:  Moist heat to Rt iliopsoas following TPDN x 5  minutes      TODAY'S TREATMENT                                                                          DATE: 10/06/2023: Therex: Time spent in education with verbal cues and visual demonstration of select exercises.  Gave cues for progression of wrist extensor to eccentric loading with increased resistance with recommendation of 2x/per day 3 x 10.  Also spent time in activity modification education  with exercises and general daily activity with advice to avoid activity that increased pain > 5/10 to avoid exacerbations. Question and answer time spent in review.    Manual: Compression to Rt wrist extensors, Rt hip adductor group.  Percussive device to Rt adductor group.   Trigger Point Dry-Needling  Treatment instructions: Expect mild to moderate muscle soreness. S/S of pneumothorax if dry needled over a lung field, and to seek immediate medical attention should they occur. Patient verbalized understanding of these instructions and education.  Patient Consent Given: Yes Education handout provided: Previously provided Muscles treated: Rt wrist extensors, Rt proximal hip adductors Treatment response/outcome: twitch response c concordant symptoms, fair tolerance overall.     TODAY'S TREATMENT                                                                          DATE:09/29/23:  Therex: Nustep: Level 6 x 5 minutes UE/LE Sidleying hip abduction  2 x 10  Sidelying hip abduction/extension circles both direction x 10 (pt with increased difficulty and required rest breaks due to muscle cramping) Sidelying reverse clams: 2 x 10  Manual:  Skilled palpation to active trigger points for TPDN Trigger Point Dry-Needling  Treatment instructions: Expect mild to moderate muscle soreness. S/S of pneumothorax if dry needled over a lung field, and to seek immediate medical attention should they occur. Patient verbalized understanding of these instructions and education.  Patient Consent Given:  Yes Education handout provided: Yes Muscles treated: Rt wrist extensors, Rt proximal rectus femoris. Electrical stimulation performed: No Parameters: N/A Treatment response/outcome: twitch response noted, pt with good tolerance to treatment     TODAY'S TREATMENT                                                                          DATE: 09/22/23:  Therex: HEP instruction/performance c cues for techniques, handout provided.  Trial set performed of each for comprehension and symptom assessment.  See below for exercise list  Manual:  Skilled palpation to active trigger points for TPDN Trigger Point Dry-Needling  Treatment instructions: Expect mild to moderate muscle soreness. S/S of pneumothorax if dry needled over a lung field, and to seek immediate medical attention should they occur. Patient verbalized understanding of these instructions and education.  Patient Consent Given: Yes Education handout provided: Yes Muscles treated: Rt wrist extensors Electrical stimulation performed: No Parameters: N/A Treatment response/outcome: twitch response noted, pt with good tolerance to treatment    PATIENT EDUCATION:  Education details: HEP, POC Person educated: Patient Education method: Programmer, multimedia, Demonstration, Verbal cues, and Handouts Education comprehension: verbalized understanding, returned demonstration, and verbal cues required  HOME EXERCISE PROGRAM: Access Code: [email or web address] URL: [email or web address] Date: 09/22/2023 Prepared by: Narda Amber  Exercises - Standing Wrist Flexion Stretch  - 1-2 x daily - 7 x weekly - 3-5 reps - 20 seconds hold - Standing Wrist Extension Stretch  - 1-2 x daily - 7 x weekly - 3-5 reps - 20 seconds hold - Forearm Pronation and Supination with Hammer  - 1-2 x daily - 7 x weekly - 2 sets - 10 reps - Wrist Extension with Resistance  - 1-2 x daily - 7 x weekly - 2 sets - 10 reps - Hip Flexor Stretch with Chair  - 1-2 x daily - 7 x  weekly - 3-5 reps - 20 seconds hold - Half Kneeling Hip Flexor Stretch with Sidebend  - 1-2 x daily - 7 x weekly - 3-5 reps - 20 seconds hold - Supine Figure 4 Piriformis Stretch  - 1-2 x daily - 7 x weekly - 3-5 reps - 20 seconds hold  ASSESSMENT:  CLINICAL IMPRESSION: Pt presenting today with pain in Rt groin, Pt with active trigger points noted over Rt iliopsoas. TPDN performed with Ivery Quale, PT, DTP palpating the femoral artery during TPDN to iliopsoas. Pt with multiple twitch responses noted and muscle fatigue following as well as increased mobility. TPDN also performed over extensor muscle of Rt UE with good response. Pt reporting overall improvements in everyday pain since beginning therapy however still reporting pain with certain movement patterns. Pt was instructed in adductor stretching and strengthening using gym equipment at low levels of resistance. Recommending continued skilled PT interventions.    OBJECTIVE IMPAIRMENTS: decreased activity tolerance, difficulty walking, decreased ROM, decreased strength, increased edema, impaired flexibility, impaired UE functional use, and pain.   ACTIVITY LIMITATIONS: lifting, sitting, squatting, and recreational activities  PARTICIPATION LIMITATIONS: driving, community activity, and recreational activities  PERSONAL FACTORS:  see pertinent history above  are also affecting patient's functional outcome.   REHAB POTENTIAL: Good  CLINICAL DECISION MAKING: Stable/uncomplicated  EVALUATION COMPLEXITY: Moderate   GOALS: Goals reviewed with patient? Yes  SHORT TERM GOALS: (target date for Short term goals are 3 weeks 10/13/2023)   1.  Patient will demonstrate independent use of home exercise program to maintain progress from in clinic treatments.  Goal status: on going 10/06/2023  LONG TERM GOALS: (target dates for all long term goals are 10 weeks  12/01/2023 )   1. Patient will demonstrate/report pain at worst less than or equal to  2/10 to facilitate minimal limitation in daily activity secondary to pain symptoms.  Goal status: on-going 10/23/23   2. Patient will demonstrate independent use of home exercise program to facilitate ability to maintain/progress functional gains from skilled physical therapy services.  Goal status: on-going 10/13/23   3. Patient will demonstrate FOTO outcome > or = 82 % to indicate reduced disability due  to condition.  Goal status: New   4.  Patient will demonstrate Rt  LE MMT 5/5 throughout to faciltiate usual transfers, stairs, squatting at [facility] for daily life.   Goal status: New   5.  Patient will demonstrate ability to perform pronation/supination of Rt UE with 5# weight with no pain reported.  Goal status: New   6.  Pt will be able to transfer in and out of his vehicle with no pain noted in Rt hip/groin.  Goal status: New   7.  Pt will be able begin running short distances (1/4 mile) with pain </= 2/10 in his Rt groin/hip.  Goal Status: New   PLAN:  PT FREQUENCY: 1-2x/week  PT DURATION: 10 weeks  PLANNED INTERVENTIONS: Can include 78469- PT Re-evaluation, 97110-Therapeutic exercises, 97530- Therapeutic activity, [id number]- Neuromuscular re-education, 97535- Self Care, 97140- Manual therapy, [phone]- Gait training, [phone]- Orthotic Fit/training, [phone]- Canalith repositioning, [id number]- Aquatic Therapy, 97014- Electrical stimulation (unattended), [id number]- Electrical stimulation (manual), [id number]- Vasopneumatic device, [id number]- Ultrasound, [id number]- Traction (mechanical), [id number]- Ionotophoresis 4mg /ml Dexamethasone, Patient/Family education, Balance training, Stair training, Taping, Dry Needling, Joint mobilization, Joint manipulation, Spinal manipulation, Spinal mobilization, Scar mobilization, Vestibular training, Visual/preceptual remediation/compensation, DME instructions, Cryotherapy, and Moist heat.  All performed as medically necessary.  All included unless contraindicated  PLAN FOR NEXT  SESSION:  eccentric focus for wrist extension with blue band progression, tri-planar hip mobility and strengthening, hip adductor stretching and strengthening     Next MD visit: 10/18/23  Narda Amber, PT, MPT 10/13/23 12:08 PM   10/13/23  12:08 PM

## 2023-10-18 ENCOUNTER — Other Ambulatory Visit: Payer: Self-pay

## 2023-10-18 ENCOUNTER — Encounter: Payer: Self-pay | Admitting: Sports Medicine

## 2023-10-18 ENCOUNTER — Encounter: Payer: Self-pay | Admitting: Physical Therapy

## 2023-10-18 ENCOUNTER — Ambulatory Visit (INDEPENDENT_AMBULATORY_CARE_PROVIDER_SITE_OTHER): Payer: BC Managed Care – PPO | Admitting: Sports Medicine

## 2023-10-18 ENCOUNTER — Ambulatory Visit (INDEPENDENT_AMBULATORY_CARE_PROVIDER_SITE_OTHER): Payer: BC Managed Care – PPO | Admitting: Physical Therapy

## 2023-10-18 DIAGNOSIS — R262 Difficulty in walking, not elsewhere classified: Secondary | ICD-10-CM

## 2023-10-18 DIAGNOSIS — M25521 Pain in right elbow: Secondary | ICD-10-CM | POA: Diagnosis not present

## 2023-10-18 DIAGNOSIS — M6281 Muscle weakness (generalized): Secondary | ICD-10-CM

## 2023-10-18 DIAGNOSIS — K4090 Unilateral inguinal hernia, without obstruction or gangrene, not specified as recurrent: Secondary | ICD-10-CM | POA: Diagnosis not present

## 2023-10-18 DIAGNOSIS — G8929 Other chronic pain: Secondary | ICD-10-CM | POA: Diagnosis not present

## 2023-10-18 DIAGNOSIS — R1031 Right lower quadrant pain: Secondary | ICD-10-CM

## 2023-10-18 DIAGNOSIS — M7711 Lateral epicondylitis, right elbow: Secondary | ICD-10-CM

## 2023-10-18 DIAGNOSIS — M25551 Pain in right hip: Secondary | ICD-10-CM

## 2023-10-18 NOTE — Progress Notes (Unsigned)
   Ryan Clements - 57 y.o. male MRN [id number]  Date of birth: 17-Nov-1965  Office Visit Note: Visit Date: 10/18/2023 PCP: Danella Penton, MD Referred by: Danella Penton, MD  Subjective: Chief Complaint  Patient presents with  . Right Hip - Follow-up, Pain  . Right Elbow - Pain   HPI: Ryan Clements is a pleasant 57 y.o. male who presents today for follow-up of right elbow and right hip pain.  Right elbow - pain in the lateral epicondyle/extensor wad  Right groin -   Pertinent ROS were reviewed with the patient and found to be negative unless otherwise specified above in HPI.   Assessment & Plan: Visit Diagnoses: No diagnosis found.  Plan: ***  Follow-up: No follow-ups on file.   Meds & Orders: No orders of the defined types were placed in this encounter.  No orders of the defined types were placed in this encounter.    Procedures: No procedures performed      Clinical History: No specialty comments available.  He reports that he has never smoked. He has never used smokeless tobacco. No results for input(s): "HGBA1C", "LABURIC" in the last 8760 hours.  Objective:   Vital Signs: There were no vitals taken for this visit.  Physical Exam  Gen: Well-appearing, in no acute distress; non-toxic CV: Regular Rate. Well-perfused. Warm.  Resp: Breathing unlabored on room air; no wheezing. Psych: Fluid speech in conversation; appropriate affect; normal thought process Neuro: Sensation intact throughout. No gross coordination deficits.   Ortho Exam - ***  Imaging: No results found.  Past Medical/Family/Surgical/Social History: Medications & Allergies reviewed per EMR, new medications updated. Patient Active Problem List   Diagnosis Date Noted  . Upper airway cough syndrome 03/05/2014  . Allergic rhinitis 03/05/2014  . GERD (gastroesophageal reflux disease) 03/05/2014   Past Medical History:  Diagnosis Date  . Asthma   . Environmental allergies   . Headaches due to  old head injury   . Hypertension   . Irregular heart rhythm    Family History  Problem Relation Age of Onset  . Allergies Mother   . Asthma Mother   . Heart disease Maternal Grandfather   . Heart disease Paternal Uncle   . Heart disease Maternal Uncle   . Heart disease Maternal Aunt   . Cancer Maternal Aunt        brain  . Cancer Maternal Grandmother        lung   Past Surgical History:  Procedure Laterality Date  . WRIST ARTHROSCOPY  2003   Social History   Occupational History  . Not on file  Tobacco Use  . Smoking status: Never  . Smokeless tobacco: Never  Substance and Sexual Activity  . Alcohol use: Yes    Comment: occasional wine or beer  . Drug use: No  . Sexual activity: Not on file

## 2023-10-18 NOTE — Progress Notes (Unsigned)
Patient says that overall he does not feel much better. He says that during his daily activities he does not notice the elbow or the groin, but specific movements for each do give him pain. He mentions holding a coffee pot giving him pain in the elbow, and any quick movements giving him pain in the groin. He says that dry needling seems to help the elbow temporarily, but pain always returns. As far as the groin, he says that although he is lifting lighter weights, strength training does not seem to bother him; higher intensity cardio and quick movements do increase his pain. He also mentions sneezing giving him the worst pain that he will feel in his groin.

## 2023-10-18 NOTE — Therapy (Addendum)
 OUTPATIENT PHYSICAL THERAPY TREATMENT  DISCHARGE   Patient Name: Ryan Clements MRN: [id number] DOB:11/22/1965, 57 y.o., male Today's Date: 10/18/2023  END OF SESSION:  PT End of Session - 10/18/23 0849     Visit Number 5    Number of Visits 20    Date for PT Re-Evaluation 12/01/23    PT Start Time 0803    PT Stop Time 0843    PT Time Calculation (min) 40 min    Activity Tolerance Patient tolerated treatment well    Behavior During Therapy [facility] for tasks assessed/performed               Past Medical History:  Diagnosis Date   Asthma    Environmental allergies    Headaches due to old head injury    Hypertension    Irregular heart rhythm    Past Surgical History:  Procedure Laterality Date   WRIST ARTHROSCOPY  2003   Patient Active Problem List   Diagnosis Date Noted   Upper airway cough syndrome 03/05/2014   Allergic rhinitis 03/05/2014   GERD (gastroesophageal reflux disease) 03/05/2014    PCP: Cleotilde Oneil FALCON, MD   REFERRING PROVIDER: Burnetta Brunet, DO   REFERRING DIAG:  Diagnosis  M25.551 (ICD-10-CM) - Pain in right hip  S39.81XD (ICD-10-CM) - Sports hernia, subsequent encounter  M25.521,G89.29 (ICD-10-CM) - Chronic elbow pain, right  M77.8 (ICD-10-CM) - Tendinitis of right elbow    THERAPY DIAG:  Pain in right elbow  Pain in right hip  Difficulty in walking, not elsewhere classified  Muscle weakness (generalized)  Rationale for Evaluation and Treatment: Rehabilitation  ONSET DATE: hip: end of August  SUBJECTIVE:  SUBJECTIVE STATEMENT: Pt stating pain relief following DN but then pain returns within 30 minutes to 1 hour after session. Pt reporting 10/10 pain in his groin when sneezing. Pt still reporting pain with lifting and rotation in his Rt UE.   PERTINENT HISTORY: Trigger finger left little finger -has had complete resolution of his triggering status post corticosteroid injections.  PMH: asthma, HTN, irregular heart rhythm, wrist  arthroscopy   PAIN:  NPRS scale: no pain at rest in arm, 5-6/10 with lifting his coffee pot, 10/10 pain in his groin when sneezing, 5-6/10 with hip rotation.  Pain description: pop in Rt hip/groin, achy soreness in Rt forarm Aggravating factors: getting into car, turning door nobs Relieving factors: resting   PRECAUTIONS: None  WEIGHT BEARING RESTRICTIONS: No  FALLS:  Has patient fallen in last 6 months? No  LIVING ENVIRONMENT: Lives with: n/a Lives in: House/apartment Has following equipment at home: None  PLOF: Independent  PATIENT GOALS: I would like to be able to workout at the gym and return to running with out pain  Next MD visit: 10/18/23  OBJECTIVE:   DIAGNOSTIC FINDINGS:  09/22/2023 review:    09/20/23: 2 views of the right hip including AP and lateral femoral ordered and  reviewed by myself.  X-rays demonstrate minimal arthritic change about the  hip.  Femoral head is well located.  No acute fracture or otherwise bony  abnormality noted.  09/20/23:wrist:  1. Flexor pollicis longus tenosynovitis. 2. Soft tissue and intramuscular edema at the proximal thumb, partially imaged at the edge of the field of view. If concern for soft tissue injury of the thumb, consider dedicated left thumb MRI. 3. Findings which raise the possibility for carpal tunnel syndrome. 4. Trace extensor compartment 2 tenosynovitis. 5. 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 6. Mild synovitis within the DRUJ.rist  PATIENT SURVEYS:  09/22/23: FOTO intake: 75      COGNITION: 09/22/2023 Overall cognitive status: WFL   PALPATION 10/06/2023: Trigger point tenderness c concordant symptoms noted in Rt elbow extensor group, Rt hip adductors, proximal rectus femoris, pectineus.    TESTING:  09/22/23: Positive Thomas test on the Rt   LOWER EXTREMITY ROM:   ROM Right 08/2723 Left 08/2723  Hip flexion 90 100  Hip extension    Hip abduction    Hip adduction    Hip internal  rotation    Hip external rotation    Knee flexion 125 125  Knee extension    Ankle dorsiflexion    Ankle plantarflexion    Ankle inversion    Ankle eversion     (Blank rows = not tested)  LOWER EXTREMITY MMT:  MMT Right 09/22/23 Left 08/2723  Hip flexion 4 5  Hip extension    Hip abduction 4 5  Hip adduction 4 5  Hip internal rotation 4 5  Hip external rotation 4 5  Knee flexion 5 5  Knee extension 5 5  Ankle dorsiflexion    Ankle plantarflexion    Ankle inversion    Ankle eversion     (Blank rows = not tested)  LOWER EXTREMITY SPECIAL TESTS:  09/22/2023 Hip special tests: Debby test: positive  right  GAIT: 09/22/2023 Distance walked: 50 feet level surface Assistive device utilized: None Level of assistance: Complete Independence                                                                                                                                                                        TODAY'S TREATMENT                                                                          DATE:10/18/23:  Therex: Standing hip flexor stretch x 3 holding 30 sec Adductor stretching in side lunge position x 4 holding 30 sec Manual:  IASTM to Rt UE: wrist extensors, tricpes  Passive stretching to Rt Wrist flexors/extensors   DN held to pt's forearm due to no pain reported with rotation following IASTM this visit.     TODAY'S TREATMENT  DATE:10/13/23:  Therex: Standing: hip rotation in 90 deg hip flexion x 10 bil LE c UE support as needed Standing hip flexor stretch x 3 holding 30 sec Y reaching x 10 using bil LE's Standing side lunges x 5 bil  Adductor stretching in side lunge position x 1 holding 30 sec Manual:  Skilled palpation to active trigger points for TPDN Trigger Point Dry-Needling  Treatment instructions: Expect mild to moderate muscle soreness. S/S of pneumothorax if dry needled  over a lung field, and to seek immediate medical attention should they occur. Patient verbalized understanding of these instructions and education.  Patient Consent Given: Yes Education handout provided: Yes Muscles treated: Rt wrist extensors, Rt iliopsoas Electrical stimulation performed: No Parameters: N/A Treatment response/outcome: twitch response noted, pt with good tolerance to treatment of Rt UE, pt with increased in tension and reporting more pain with TPDN to iliopsoas Modalites:  Moist heat to Rt iliopsoas following TPDN x 5 minutes      TODAY'S TREATMENT                                                                          DATE: 10/06/2023: Therex: Time spent in education with verbal cues and visual demonstration of select exercises.  Gave cues for progression of wrist extensor to eccentric loading with increased resistance with recommendation of 2x/per day 3 x 10.  Also spent time in activity modification education  with exercises and general daily activity with advice to avoid activity that increased pain > 5/10 to avoid exacerbations. Question and answer time spent in review.    Manual: Compression to Rt wrist extensors, Rt hip adductor group.  Percussive device to Rt adductor group.   Trigger Point Dry-Needling  Treatment instructions: Expect mild to moderate muscle soreness. S/S of pneumothorax if dry needled over a lung field, and to seek immediate medical attention should they occur. Patient verbalized understanding of these instructions and education.  Patient Consent Given: Yes Education handout provided: Previously provided Muscles treated: Rt wrist extensors, Rt proximal hip adductors Treatment response/outcome: twitch response c concordant symptoms, fair tolerance overall.     TODAY'S TREATMENT                                                                          DATE:09/29/23:  Therex: Nustep: Level 6 x 5 minutes UE/LE Sidleying hip abduction  2 x 10   Sidelying hip abduction/extension circles both direction x 10 (pt with increased difficulty and required rest breaks due to muscle cramping) Sidelying reverse clams: 2 x 10  Manual:  Skilled palpation to active trigger points for TPDN Trigger Point Dry-Needling  Treatment instructions: Expect mild to moderate muscle soreness. S/S of pneumothorax if dry needled over a lung field, and to seek immediate medical attention should they occur. Patient verbalized understanding of these instructions and education.  Patient Consent Given: Yes Education handout provided: Yes Muscles treated: Rt wrist extensors, Rt proximal rectus femoris.  Electrical stimulation performed: No Parameters: N/A Treatment response/outcome: twitch response noted, pt with good tolerance to treatment     TODAY'S TREATMENT                                                                          DATE: 09/22/23:  Therex: HEP instruction/performance c cues for techniques, handout provided.  Trial set performed of each for comprehension and symptom assessment.  See below for exercise list Manual:  Skilled palpation to active trigger points for TPDN Trigger Point Dry-Needling  Treatment instructions: Expect mild to moderate muscle soreness. S/S of pneumothorax if dry needled over a lung field, and to seek immediate medical attention should they occur. Patient verbalized understanding of these instructions and education.  Patient Consent Given: Yes Education handout provided: Yes Muscles treated: Rt wrist extensors Electrical stimulation performed: No Parameters: N/A Treatment response/outcome: twitch response noted, pt with good tolerance to treatment    PATIENT EDUCATION:  Education details: HEP, POC Person educated: Patient Education method: Programmer, multimedia, Demonstration, Verbal cues, and Handouts Education comprehension: verbalized understanding, returned demonstration, and verbal cues required  HOME EXERCISE  PROGRAM: Access Code: [email or web address] URL: [email or web address] Date: 09/22/2023 Prepared by: Delon Lunger  Exercises - Standing Wrist Flexion Stretch  - 1-2 x daily - 7 x weekly - 3-5 reps - 20 seconds hold - Standing Wrist Extension Stretch  - 1-2 x daily - 7 x weekly - 3-5 reps - 20 seconds hold - Forearm Pronation and Supination with Hammer  - 1-2 x daily - 7 x weekly - 2 sets - 10 reps - Wrist Extension with Resistance  - 1-2 x daily - 7 x weekly - 2 sets - 10 reps - Hip Flexor Stretch with Chair  - 1-2 x daily - 7 x weekly - 3-5 reps - 20 seconds hold - Half Kneeling Hip Flexor Stretch with Sidebend  - 1-2 x daily - 7 x weekly - 3-5 reps - 20 seconds hold - Supine Figure 4 Piriformis Stretch  - 1-2 x daily - 7 x weekly - 3-5 reps - 20 seconds hold  ASSESSMENT:  CLINICAL IMPRESSION: Pt presenting today with pain in Rt groin and reporting increased/worse pain with sneezing. Pt reported having immediate relief following DN but then the pain returns.  We held off DN this visit to pt's adductors and iliopsoas until pt gets further evaluated by Dr. Burnetta this morning. Pt had a good response to IASTM to his right wrist extensors and triceps. Recommended continued skilled PT to maximize pt's function.    OBJECTIVE IMPAIRMENTS: decreased activity tolerance, difficulty walking, decreased ROM, decreased strength, increased edema, impaired flexibility, impaired UE functional use, and pain.   ACTIVITY LIMITATIONS: lifting, sitting, squatting, and recreational activities  PARTICIPATION LIMITATIONS: driving, community activity, and recreational activities  PERSONAL FACTORS: see pertinent history above are also affecting patient's functional outcome.   REHAB POTENTIAL: Good  CLINICAL DECISION MAKING: Stable/uncomplicated  EVALUATION COMPLEXITY: Moderate   GOALS: Goals reviewed with patient? Yes  SHORT TERM GOALS: (target date for Short term goals are 3 weeks 10/13/2023)    1.  Patient will demonstrate independent use of home exercise program to maintain progress from in clinic treatments.  Goal status: on going 10/06/2023  LONG TERM GOALS: (target dates for all long term goals are 10 weeks  12/01/2023 )   1. Patient will demonstrate/report pain at worst less than or equal to 2/10 to facilitate minimal limitation in daily activity secondary to pain symptoms.  Goal status: on-going 10/23/23   2. Patient will demonstrate independent use of home exercise program to facilitate ability to maintain/progress functional gains from skilled physical therapy services.  Goal status: on-going 10/13/23   3. Patient will demonstrate FOTO outcome > or = 82 % to indicate reduced disability due to condition.  Goal status: New   4.  Patient will demonstrate Rt  LE MMT 5/5 throughout to faciltiate usual transfers, stairs, squatting at [facility] for daily life.   Goal status: New   5.  Patient will demonstrate ability to perform pronation/supination of Rt UE with 5# weight with no pain reported.  Goal status: New   6.  Pt will be able to transfer in and out of his vehicle with no pain noted in Rt hip/groin.  Goal status: New   7.  Pt will be able begin running short distances (1/4 mile) with pain </= 2/10 in his Rt groin/hip.  Goal Status: New   PLAN:  PT FREQUENCY: 1-2x/week  PT DURATION: 10 weeks  PLANNED INTERVENTIONS: Can include 02853- PT Re-evaluation, 97110-Therapeutic exercises, 97530- Therapeutic activity, [id number]- Neuromuscular re-education, 97535- Self Care, 97140- Manual therapy, [phone]- Gait training, [phone]- Orthotic Fit/training, [phone]- Canalith repositioning, [id number]- Aquatic Therapy, 97014- Electrical stimulation (unattended), [id number]- Electrical stimulation (manual), [id number]- Vasopneumatic device, [id number]- Ultrasound, [id number]- Traction (mechanical), [id number]- Ionotophoresis 4mg /ml Dexamethasone, Patient/Family education, Balance training, Stair training, Taping, Dry  Needling, Joint mobilization, Joint manipulation, Spinal manipulation, Spinal mobilization, Scar mobilization, Vestibular training, Visual/preceptual remediation/compensation, DME instructions, Cryotherapy, and Moist heat.  All performed as medically necessary.  All included unless contraindicated  PLAN FOR NEXT SESSION:  eccentric focus for wrist extension with blue band progression, tri-planar hip mobility and strengthening, hip adductor stretching and strengthening   How did IASTM go? Review Dr. Visit on 10/18/23.     Next MD visit: 10/18/23  Delon Lunger, PT, MPT 10/18/23 9:10 AM   10/18/23  9:10 AM  PHYSICAL THERAPY DISCHARGE SUMMARY  Visits from Start of Care: 5  Current functional level related to goals / functional outcomes: See above   Remaining deficits: See above   Education / Equipment: HEP   Patient agrees to discharge. Patient goals were not met. Patient is being discharged due to not returning since the last visit.

## 2023-10-19 ENCOUNTER — Encounter: Payer: Self-pay | Admitting: Sports Medicine

## 2023-10-25 ENCOUNTER — Encounter: Payer: BC Managed Care – PPO | Admitting: Physical Therapy

## 2023-10-29 ENCOUNTER — Ambulatory Visit (HOSPITAL_BASED_OUTPATIENT_CLINIC_OR_DEPARTMENT_OTHER)
Admission: RE | Admit: 2023-10-29 | Discharge: 2023-10-29 | Disposition: A | Discharge status: Home / Self Care | Payer: BC Managed Care – PPO | Source: Ambulatory Visit | Attending: Sports Medicine | Admitting: Sports Medicine

## 2023-10-29 DIAGNOSIS — G8929 Other chronic pain: Secondary | ICD-10-CM | POA: Insufficient documentation

## 2023-10-29 DIAGNOSIS — K4090 Unilateral inguinal hernia, without obstruction or gangrene, not specified as recurrent: Secondary | ICD-10-CM | POA: Diagnosis present

## 2023-10-29 DIAGNOSIS — R1031 Right lower quadrant pain: Secondary | ICD-10-CM | POA: Insufficient documentation

## 2023-11-03 ENCOUNTER — Other Ambulatory Visit: Payer: Self-pay | Admitting: Sports Medicine

## 2023-11-03 DIAGNOSIS — K4090 Unilateral inguinal hernia, without obstruction or gangrene, not specified as recurrent: Secondary | ICD-10-CM

## 2023-11-03 NOTE — Telephone Encounter (Signed)
 Referral sent

## 2023-11-26 ENCOUNTER — Other Ambulatory Visit: Payer: Self-pay | Admitting: Surgery

## 2023-11-29 ENCOUNTER — Other Ambulatory Visit: Payer: Self-pay | Admitting: Surgery

## 2023-11-29 DIAGNOSIS — R590 Localized enlarged lymph nodes: Secondary | ICD-10-CM

## 2023-11-29 DIAGNOSIS — R1031 Right lower quadrant pain: Secondary | ICD-10-CM

## 2023-12-03 ENCOUNTER — Ambulatory Visit
Admission: RE | Admit: 2023-12-03 | Discharge: 2023-12-03 | Disposition: A | Discharge status: Home / Self Care | Payer: BC Managed Care – PPO | Source: Ambulatory Visit | Attending: Surgery | Admitting: Surgery

## 2023-12-03 DIAGNOSIS — R590 Localized enlarged lymph nodes: Secondary | ICD-10-CM

## 2023-12-03 DIAGNOSIS — R1031 Right lower quadrant pain: Secondary | ICD-10-CM

## 2023-12-03 MED ORDER — IOPAMIDOL (ISOVUE-300) INJECTION 61%
100.0000 mL | Freq: Once | INTRAVENOUS | Status: AC | PRN
  Administered 2023-12-03: 100 mL via INTRAVENOUS

## 2023-12-08 ENCOUNTER — Encounter: Payer: Self-pay | Admitting: Sports Medicine

## 2023-12-08 ENCOUNTER — Encounter: Payer: Self-pay | Admitting: Physical Therapy

## 2024-02-17 ENCOUNTER — Encounter: Payer: Self-pay | Admitting: Sports Medicine

## 2024-07-25 ENCOUNTER — Ambulatory Visit
Admission: RE | Admit: 2024-07-25 | Discharge: 2024-07-25 | Disposition: A | Discharge status: Home / Self Care | Source: Ambulatory Visit | Attending: Internal Medicine | Admitting: Internal Medicine

## 2024-07-25 ENCOUNTER — Other Ambulatory Visit: Payer: Self-pay | Admitting: Internal Medicine

## 2024-07-25 DIAGNOSIS — G959 Disease of spinal cord, unspecified: Secondary | ICD-10-CM | POA: Insufficient documentation

## 2024-08-28 ENCOUNTER — Encounter: Payer: Self-pay | Admitting: Radiology
